# Patient Record
Sex: Female | Born: 1975 | Race: White | Hispanic: No | Marital: Married | Smoking: Current every day smoker
Health system: Southern US, Community
[De-identification: ages and names within clinical notes are randomized; demographics above are authoritative.]

## PROBLEM LIST (undated history)

## (undated) DIAGNOSIS — F419 Anxiety disorder, unspecified: Secondary | ICD-10-CM

## (undated) DIAGNOSIS — Z789 Other specified health status: Secondary | ICD-10-CM

## (undated) DIAGNOSIS — F32A Depression, unspecified: Secondary | ICD-10-CM

## (undated) DIAGNOSIS — N83209 Unspecified ovarian cyst, unspecified side: Secondary | ICD-10-CM

## (undated) DIAGNOSIS — Z8619 Personal history of other infectious and parasitic diseases: Secondary | ICD-10-CM

## (undated) DIAGNOSIS — F329 Major depressive disorder, single episode, unspecified: Secondary | ICD-10-CM

## (undated) HISTORY — PX: TUBAL LIGATION: SHX77

## (undated) HISTORY — PX: APPENDECTOMY: SHX54

---

## 2005-05-20 ENCOUNTER — Ambulatory Visit: Payer: Self-pay

## 2005-10-15 ENCOUNTER — Emergency Department: Payer: Self-pay | Admitting: Emergency Medicine

## 2006-04-15 ENCOUNTER — Emergency Department: Payer: Self-pay | Admitting: Emergency Medicine

## 2006-04-16 ENCOUNTER — Emergency Department: Payer: Self-pay

## 2007-03-15 ENCOUNTER — Emergency Department: Payer: Self-pay | Admitting: Unknown Physician Specialty

## 2007-09-06 ENCOUNTER — Emergency Department: Payer: Self-pay | Admitting: Emergency Medicine

## 2008-02-20 ENCOUNTER — Other Ambulatory Visit: Payer: Self-pay

## 2008-02-20 ENCOUNTER — Emergency Department: Payer: Self-pay | Admitting: Emergency Medicine

## 2008-03-09 ENCOUNTER — Emergency Department: Payer: Self-pay | Admitting: Internal Medicine

## 2008-07-31 ENCOUNTER — Emergency Department: Payer: Self-pay | Admitting: Unknown Physician Specialty

## 2008-12-04 ENCOUNTER — Emergency Department: Payer: Self-pay | Admitting: Unknown Physician Specialty

## 2009-01-10 ENCOUNTER — Emergency Department: Payer: Self-pay | Admitting: Internal Medicine

## 2009-01-11 ENCOUNTER — Emergency Department: Payer: Self-pay | Admitting: Emergency Medicine

## 2009-07-05 ENCOUNTER — Emergency Department: Payer: Self-pay | Admitting: Emergency Medicine

## 2009-11-28 ENCOUNTER — Ambulatory Visit: Payer: Self-pay

## 2011-03-11 ENCOUNTER — Emergency Department: Payer: Self-pay | Admitting: Emergency Medicine

## 2011-03-21 ENCOUNTER — Emergency Department: Payer: Self-pay | Admitting: Unknown Physician Specialty

## 2011-07-22 ENCOUNTER — Emergency Department: Payer: Self-pay | Admitting: *Deleted

## 2013-08-23 ENCOUNTER — Emergency Department: Payer: Self-pay | Admitting: Emergency Medicine

## 2013-09-12 ENCOUNTER — Emergency Department: Payer: Self-pay | Admitting: Emergency Medicine

## 2013-09-12 LAB — COMPREHENSIVE METABOLIC PANEL
ALK PHOS: 54 U/L
ANION GAP: 9 (ref 7–16)
Albumin: 3.5 g/dL (ref 3.4–5.0)
BUN: 13 mg/dL (ref 7–18)
Bilirubin,Total: 0.2 mg/dL (ref 0.2–1.0)
CALCIUM: 8.7 mg/dL (ref 8.5–10.1)
CHLORIDE: 107 mmol/L (ref 98–107)
Co2: 28 mmol/L (ref 21–32)
Creatinine: 0.9 mg/dL (ref 0.60–1.30)
EGFR (African American): >60
GLUCOSE: 91 mg/dL (ref 65–99)
OSMOLALITY: 287 (ref 275–301)
POTASSIUM: 4 mmol/L (ref 3.5–5.1)
SGOT(AST): 28 U/L (ref 15–37)
SGPT (ALT): 47 U/L (ref 12–78)
Sodium: 144 mmol/L (ref 136–145)
Total Protein: 6.7 g/dL (ref 6.4–8.2)

## 2013-09-12 LAB — URINALYSIS, COMPLETE
Bilirubin,UR: NEGATIVE
Glucose,UR: NEGATIVE mg/dL (ref 0–75)
Ketone: NEGATIVE
LEUKOCYTE ESTERASE: NEGATIVE
NITRITE: NEGATIVE
PROTEIN: NEGATIVE
Ph: 6 (ref 4.5–8.0)
RBC,UR: <1 /HPF (ref 0–5)
Specific Gravity: 1.018 (ref 1.003–1.030)
Squamous Epithelial: 6
WBC UR: 1 /HPF (ref 0–5)

## 2013-09-12 LAB — CBC
HCT: 42.7 % (ref 35.0–47.0)
HGB: 14.5 g/dL (ref 12.0–16.0)
MCH: 30.4 pg (ref 26.0–34.0)
MCHC: 34 g/dL (ref 32.0–36.0)
MCV: 90 fL (ref 80–100)
Platelet: 335 10*3/uL (ref 150–440)
RBC: 4.76 10*6/uL (ref 3.80–5.20)
RDW: 13.4 % (ref 11.5–14.5)
WBC: 9 10*3/uL (ref 3.6–11.0)

## 2013-09-12 LAB — GC/CHLAMYDIA PROBE AMP

## 2013-09-12 LAB — WET PREP, GENITAL

## 2014-05-11 ENCOUNTER — Emergency Department: Payer: Self-pay | Admitting: Internal Medicine

## 2014-06-27 ENCOUNTER — Emergency Department: Payer: Self-pay | Admitting: Emergency Medicine

## 2014-06-27 LAB — URINALYSIS, COMPLETE
BACTERIA: NONE SEEN
BILIRUBIN, UR: NEGATIVE
Blood: NEGATIVE
GLUCOSE, UR: NEGATIVE mg/dL (ref 0–75)
Ketone: NEGATIVE
Leukocyte Esterase: NEGATIVE
Nitrite: NEGATIVE
PH: 6 (ref 4.5–8.0)
Protein: NEGATIVE
RBC,UR: 1 /HPF (ref 0–5)
Specific Gravity: 1.016 (ref 1.003–1.030)
WBC UR: 1 /HPF (ref 0–5)

## 2014-06-27 LAB — COMPREHENSIVE METABOLIC PANEL
ALBUMIN: 3.3 g/dL — AB (ref 3.4–5.0)
ALK PHOS: 58 U/L
ALT: 24 U/L
Anion Gap: 7 (ref 7–16)
BILIRUBIN TOTAL: 0.1 mg/dL — AB (ref 0.2–1.0)
BUN: 12 mg/dL (ref 7–18)
CALCIUM: 8.5 mg/dL (ref 8.5–10.1)
CO2: 28 mmol/L (ref 21–32)
Chloride: 111 mmol/L — ABNORMAL HIGH (ref 98–107)
Creatinine: 0.87 mg/dL (ref 0.60–1.30)
EGFR (African American): >60
EGFR (Non-African Amer.): >60
Glucose: 85 mg/dL (ref 65–99)
Osmolality: 290 (ref 275–301)
Potassium: 3.9 mmol/L (ref 3.5–5.1)
SGOT(AST): 20 U/L (ref 15–37)
Sodium: 146 mmol/L — ABNORMAL HIGH (ref 136–145)
Total Protein: 6.7 g/dL (ref 6.4–8.2)

## 2014-06-27 LAB — CBC WITH DIFFERENTIAL/PLATELET
BASOS ABS: 0.2 10*3/uL — AB (ref 0.0–0.1)
Basophil %: 1.4 %
EOS ABS: 0.2 10*3/uL (ref 0.0–0.7)
Eosinophil %: 1.7 %
HCT: 43.4 % (ref 35.0–47.0)
HGB: 14.3 g/dL (ref 12.0–16.0)
Lymphocyte #: 3.8 10*3/uL — ABNORMAL HIGH (ref 1.0–3.6)
Lymphocyte %: 34.2 %
MCH: 29.8 pg (ref 26.0–34.0)
MCHC: 33 g/dL (ref 32.0–36.0)
MCV: 90 fL (ref 80–100)
Monocyte #: 0.7 x10 3/mm (ref 0.2–0.9)
Monocyte %: 6.2 %
NEUTROS ABS: 6.3 10*3/uL (ref 1.4–6.5)
Neutrophil %: 56.5 %
Platelet: 360 10*3/uL (ref 150–440)
RBC: 4.8 10*6/uL (ref 3.80–5.20)
RDW: 13.6 % (ref 11.5–14.5)
WBC: 11.2 10*3/uL — AB (ref 3.6–11.0)

## 2014-06-28 IMAGING — CT CT ABD-PELV W/ CM
2 of 4 series · 17 of 46 positions shown, 19 images · IV contrast (isovue)
Comparison: None.

CLINICAL DATA: Left lower quadrant and flank pain.

EXAM:
CT ABDOMEN AND PELVIS WITH CONTRAST
TECHNIQUE: Multidetector CT imaging of the abdomen and pelvis was performed
using the standard protocol following bolus administration of
intravenous contrast.
CONTRAST:  100 mL Isovue 370

[Series 2: routine abd pel with · axial · 0.69mm/px · z∈[-486,-61]mm · 14 of 93 slices shown, 16 images]
[im 4/93  soft-tissue]
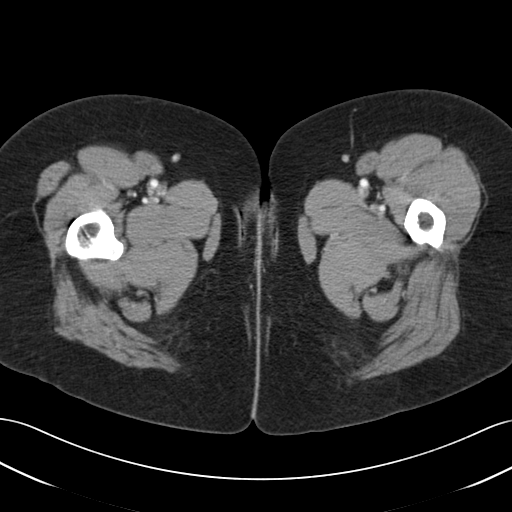
[im 4/93  bone]
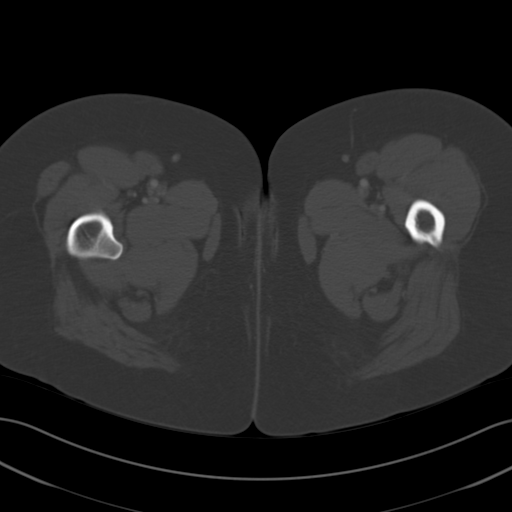
[im 12/93  soft-tissue]
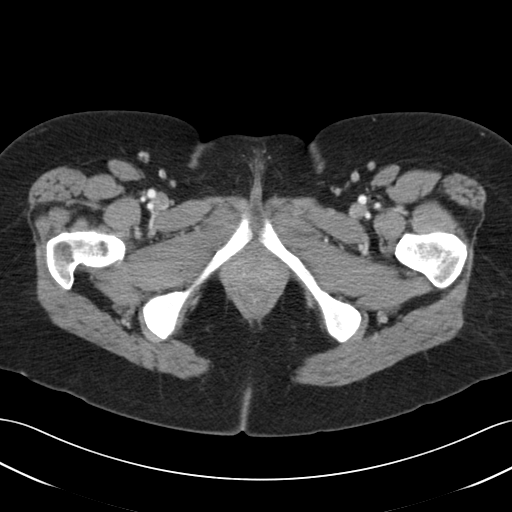
[im 20/93  soft-tissue]
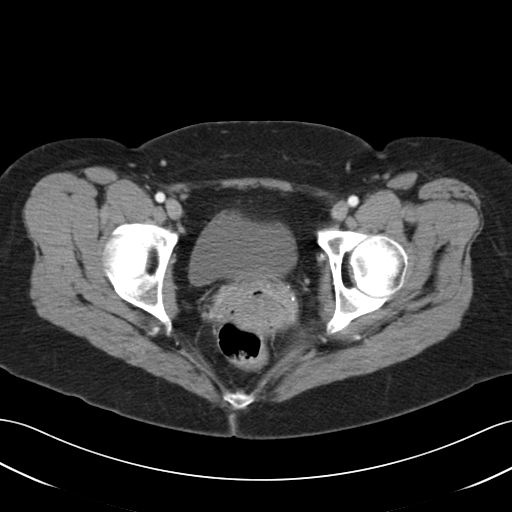
[im 24/93  soft-tissue]
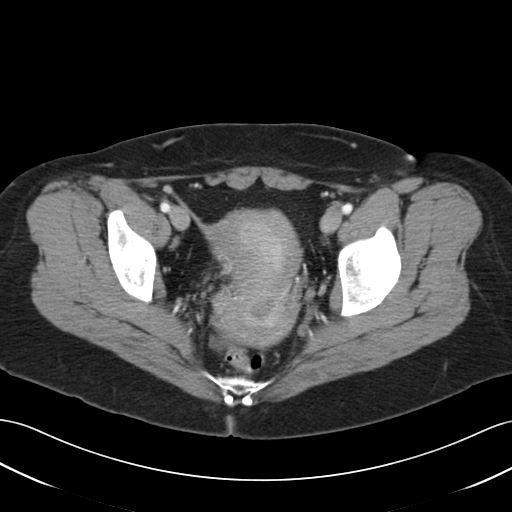
[im 31/93  soft-tissue]
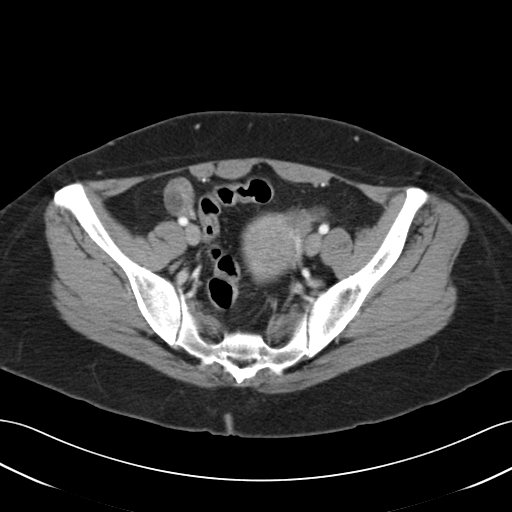
[im 39/93  soft-tissue]
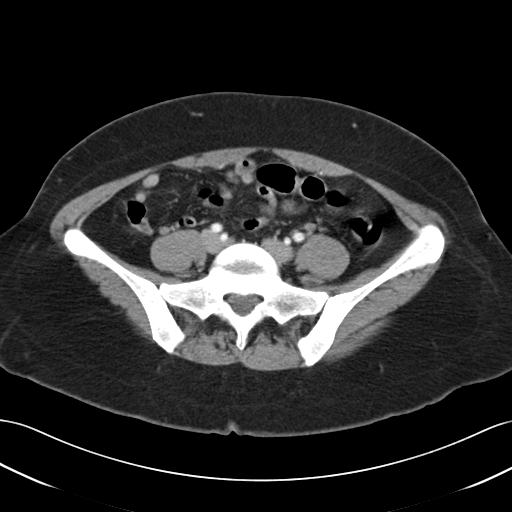
[im 43/93  soft-tissue]
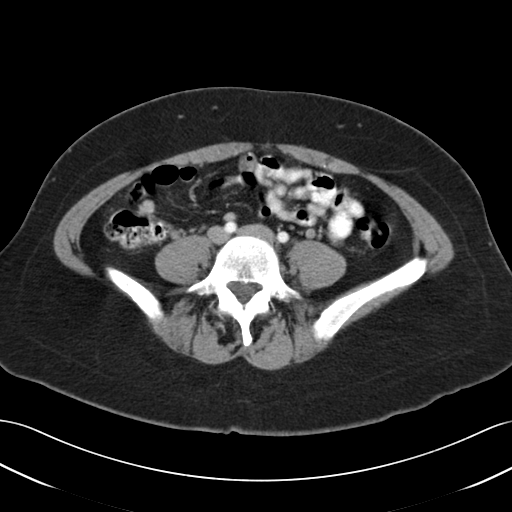
[im 50/93  soft-tissue]
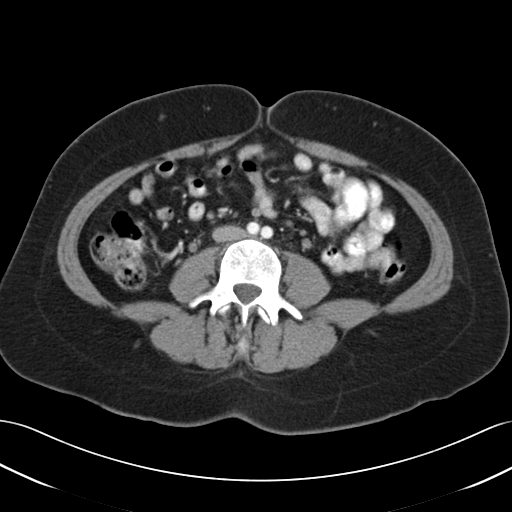
[im 54/93  soft-tissue]
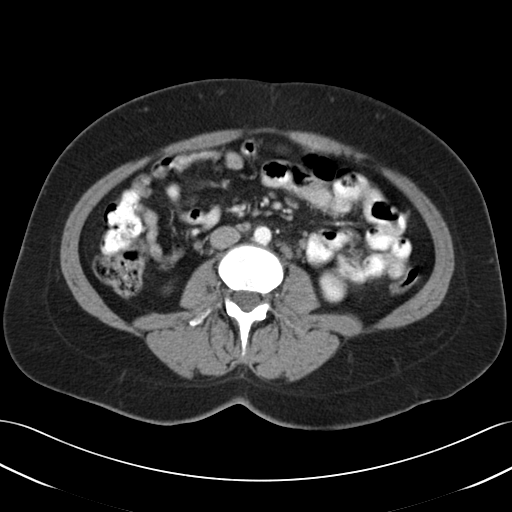
[im 54/93  bone]
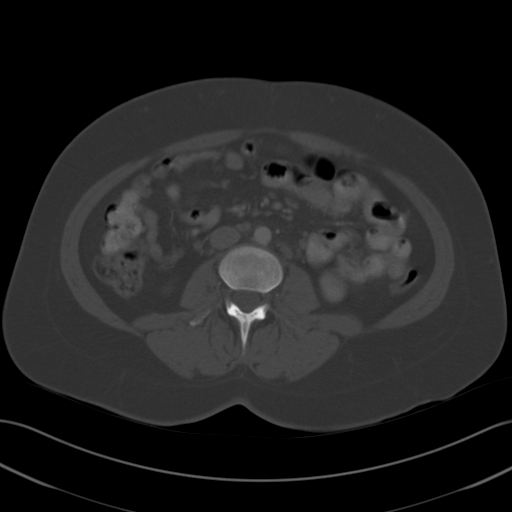
[im 62/93  soft-tissue]
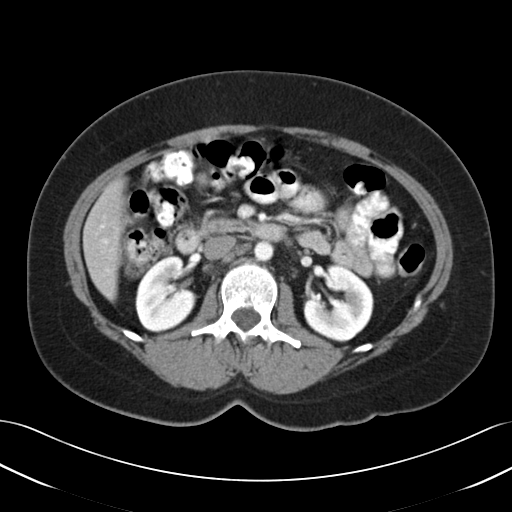
[im 70/93  soft-tissue]
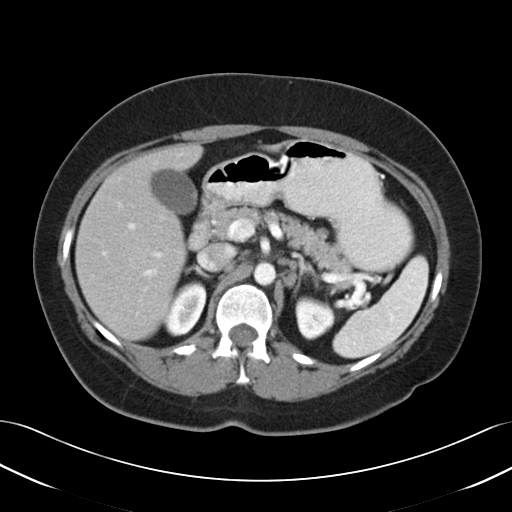
[im 73/93  soft-tissue]
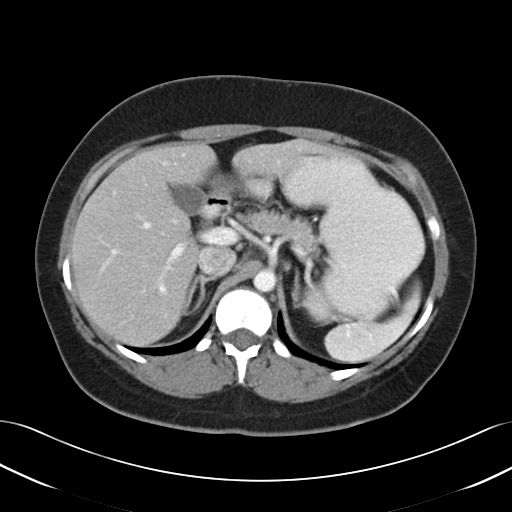
[im 81/93  soft-tissue]
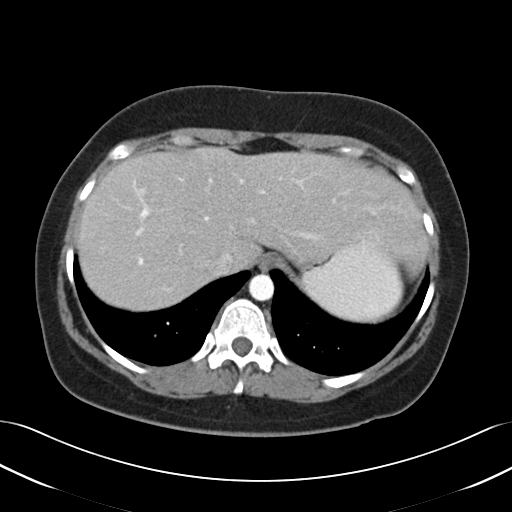
[im 89/93  soft-tissue]
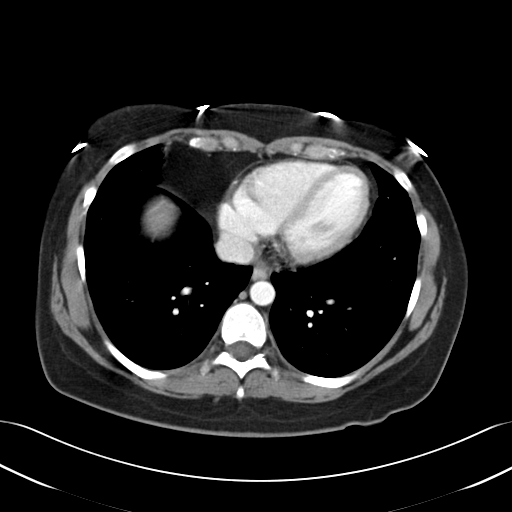

[Series 5: cor routine abd pel with · coronal · 0.93mm/px · 3 of 120 slices shown]
[im 40/120  soft-tissue]
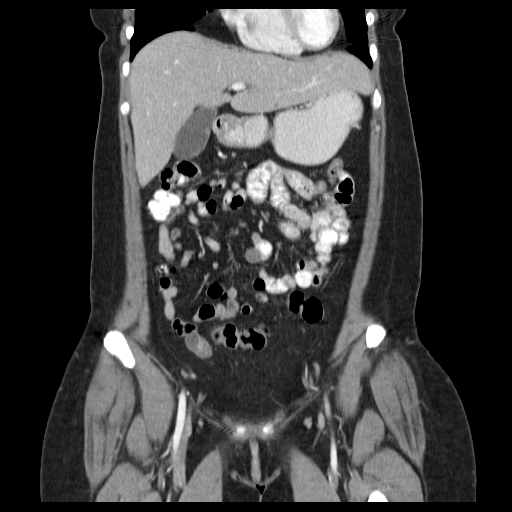
[im 53/120  soft-tissue]
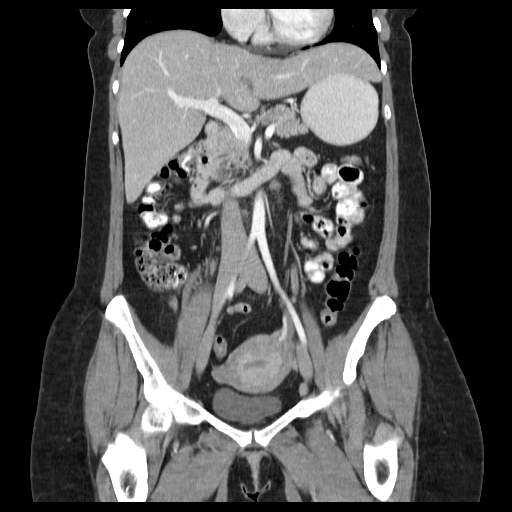
[im 67/120  soft-tissue]
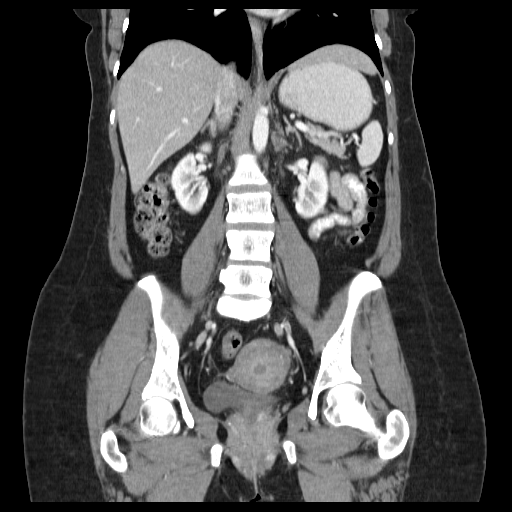

[17 of 46 positions shown; findings below may reference images not displayed]

FINDINGS: Lung bases are clear.  Negative for free air.

Normal appearance of the liver, gallbladder and portal venous
system. Normal appearance of the pancreas, spleen, adrenal glands
and kidneys. There are multiple small lymph nodes in the periaortic
region. There is mild fat stranding around the small lymph nodes.
Index periaortic lymph node measures 8 mm in the short axis on
sequence 2, image 34. Small lymph nodes in the gastrohepatic
ligament. Small lymph nodes in the aortocaval stations. Mildly
prominent left iliac lymph node on sequence 2, image 71.

No acute bone abnormality. A trace amount of free fluid in the
pelvis.

The endometrium is mildly prominent. No gross abnormality to the
uterus. Evidence for bilateral ovarian follicles. Normal appearance
of the urinary bladder. Normal appearance of the small and large
bowel. Appendix is not well visualized but no inflammatory changes
in the right lower quadrant.
IMPRESSION: Multiple small lymph nodes in the retroperitoneum with mild fat
stranding. These findings are nonspecific but could be reactive in
etiology. Consider follow-up imaging to evaluate for stability.

Trace free fluid in the pelvis is likely physiologic.

## 2014-08-08 ENCOUNTER — Emergency Department: Payer: Self-pay | Admitting: Emergency Medicine

## 2014-08-08 LAB — URINALYSIS, COMPLETE
BILIRUBIN, UR: NEGATIVE
Glucose,UR: NEGATIVE mg/dL (ref 0–75)
KETONE: NEGATIVE
Nitrite: NEGATIVE
PH: 6 (ref 4.5–8.0)
Protein: 30
RBC,UR: 554 /HPF (ref 0–5)
SPECIFIC GRAVITY: 1.015 (ref 1.003–1.030)
Squamous Epithelial: <1
WBC UR: 169 /HPF (ref 0–5)

## 2014-08-08 LAB — COMPREHENSIVE METABOLIC PANEL
ALK PHOS: 57 U/L
AST: 18 U/L (ref 15–37)
Albumin: 3.2 g/dL — ABNORMAL LOW (ref 3.4–5.0)
Anion Gap: 6 — ABNORMAL LOW (ref 7–16)
BILIRUBIN TOTAL: 0.1 mg/dL — AB (ref 0.2–1.0)
BUN: 9 mg/dL (ref 7–18)
CALCIUM: 8.1 mg/dL — AB (ref 8.5–10.1)
CHLORIDE: 112 mmol/L — AB (ref 98–107)
CREATININE: 0.69 mg/dL (ref 0.60–1.30)
Co2: 26 mmol/L (ref 21–32)
EGFR (Non-African Amer.): >60
Glucose: 99 mg/dL (ref 65–99)
Osmolality: 286 (ref 275–301)
Potassium: 3.7 mmol/L (ref 3.5–5.1)
SGPT (ALT): 25 U/L
Sodium: 144 mmol/L (ref 136–145)
Total Protein: 6.5 g/dL (ref 6.4–8.2)

## 2014-08-08 LAB — CBC WITH DIFFERENTIAL/PLATELET
BASOS ABS: 0.3 10*3/uL — AB (ref 0.0–0.1)
Basophil %: 3.3 %
EOS ABS: 0.2 10*3/uL (ref 0.0–0.7)
Eosinophil %: 2.4 %
HCT: 43.2 % (ref 35.0–47.0)
HGB: 14.3 g/dL (ref 12.0–16.0)
Lymphocyte #: 3.2 10*3/uL (ref 1.0–3.6)
Lymphocyte %: 31.6 %
MCH: 30.3 pg (ref 26.0–34.0)
MCHC: 33.1 g/dL (ref 32.0–36.0)
MCV: 91 fL (ref 80–100)
MONOS PCT: 4.8 %
Monocyte #: 0.5 x10 3/mm (ref 0.2–0.9)
Neutrophil #: 5.8 10*3/uL (ref 1.4–6.5)
Neutrophil %: 57.9 %
Platelet: 309 10*3/uL (ref 150–440)
RBC: 4.72 10*6/uL (ref 3.80–5.20)
RDW: 13.5 % (ref 11.5–14.5)
WBC: 10.1 10*3/uL (ref 3.6–11.0)

## 2015-02-04 ENCOUNTER — Emergency Department
Admission: EM | Admit: 2015-02-04 | Discharge: 2015-02-04 | Disposition: A | Discharge status: Home / Self Care | Attending: Emergency Medicine | Admitting: Emergency Medicine

## 2015-02-04 ENCOUNTER — Encounter: Payer: Self-pay | Admitting: *Deleted

## 2015-02-04 ENCOUNTER — Emergency Department

## 2015-02-04 DIAGNOSIS — R102 Pelvic and perineal pain: Secondary | ICD-10-CM

## 2015-02-04 DIAGNOSIS — Z3202 Encounter for pregnancy test, result negative: Secondary | ICD-10-CM | POA: Insufficient documentation

## 2015-02-04 DIAGNOSIS — Z720 Tobacco use: Secondary | ICD-10-CM | POA: Diagnosis not present

## 2015-02-04 DIAGNOSIS — Z79899 Other long term (current) drug therapy: Secondary | ICD-10-CM | POA: Diagnosis not present

## 2015-02-04 DIAGNOSIS — N939 Abnormal uterine and vaginal bleeding, unspecified: Secondary | ICD-10-CM | POA: Diagnosis not present

## 2015-02-04 DIAGNOSIS — N8320 Unspecified ovarian cysts: Secondary | ICD-10-CM | POA: Diagnosis not present

## 2015-02-04 DIAGNOSIS — N83202 Unspecified ovarian cyst, left side: Secondary | ICD-10-CM

## 2015-02-04 HISTORY — DX: Unspecified ovarian cyst, unspecified side: N83.209

## 2015-02-04 HISTORY — DX: Anxiety disorder, unspecified: F41.9

## 2015-02-04 HISTORY — DX: Depression, unspecified: F32.A

## 2015-02-04 HISTORY — DX: Major depressive disorder, single episode, unspecified: F32.9

## 2015-02-04 LAB — CBC WITH DIFFERENTIAL/PLATELET
Basophils Absolute: 0.1 10*3/uL (ref 0–0.1)
Basophils Relative: 1 %
EOS ABS: 0.2 10*3/uL (ref 0–0.7)
Eosinophils Relative: 2 %
HCT: 42.4 % (ref 35.0–47.0)
Hemoglobin: 13.8 g/dL (ref 12.0–16.0)
LYMPHS ABS: 2.6 10*3/uL (ref 1.0–3.6)
LYMPHS PCT: 21 %
MCH: 28.9 pg (ref 26.0–34.0)
MCHC: 32.6 g/dL (ref 32.0–36.0)
MCV: 88.7 fL (ref 80.0–100.0)
Monocytes Absolute: 0.9 10*3/uL (ref 0.2–0.9)
Monocytes Relative: 8 %
NEUTROS ABS: 8.6 10*3/uL — AB (ref 1.4–6.5)
NEUTROS PCT: 68 %
Platelets: 328 10*3/uL (ref 150–440)
RBC: 4.79 MIL/uL (ref 3.80–5.20)
RDW: 13.3 % (ref 11.5–14.5)
WBC: 12.5 10*3/uL — AB (ref 3.6–11.0)

## 2015-02-04 LAB — HCG, QUANTITATIVE, PREGNANCY

## 2015-02-04 MED ORDER — ONDANSETRON HCL 4 MG/2ML IJ SOLN
4.0000 mg | Freq: Once | INTRAMUSCULAR | Status: AC
  Administered 2015-02-04: 4 mg via INTRAVENOUS

## 2015-02-04 MED ORDER — ONDANSETRON HCL 4 MG/2ML IJ SOLN
INTRAMUSCULAR | Status: AC
  Administered 2015-02-04: 4 mg via INTRAVENOUS
  Filled 2015-02-04: qty 2

## 2015-02-04 MED ORDER — ONDANSETRON 4 MG PO TBDP
ORAL_TABLET | ORAL | Status: AC
  Administered 2015-02-04: 4 mg
  Filled 2015-02-04: qty 1

## 2015-02-04 MED ORDER — MORPHINE SULFATE 4 MG/ML IJ SOLN
4.0000 mg | Freq: Once | INTRAMUSCULAR | Status: AC
  Administered 2015-02-04: 4 mg via INTRAVENOUS

## 2015-02-04 MED ORDER — OXYCODONE-ACETAMINOPHEN 5-325 MG PO TABS: 1.0000 | ORAL_TABLET | ORAL | Status: DC | PRN

## 2015-02-04 MED ORDER — ONDANSETRON HCL 4 MG PO TABS: 4.0000 mg | ORAL_TABLET | Freq: Three times a day (TID) | ORAL | Status: DC | PRN

## 2015-02-04 MED ORDER — IBUPROFEN 800 MG PO TABS: 800.0000 mg | ORAL_TABLET | Freq: Three times a day (TID) | ORAL | Status: DC | PRN

## 2015-02-04 MED ORDER — SODIUM CHLORIDE 0.9 % IV BOLUS (SEPSIS)
1000.0000 mL | Freq: Once | INTRAVENOUS | Status: AC
  Administered 2015-02-04: 1000 mL via INTRAVENOUS

## 2015-02-04 MED ORDER — OXYCODONE-ACETAMINOPHEN 5-325 MG PO TABS
ORAL_TABLET | ORAL | Status: AC
  Administered 2015-02-04: 1
  Filled 2015-02-04: qty 1

## 2015-02-04 MED ORDER — MORPHINE SULFATE 4 MG/ML IJ SOLN
INTRAMUSCULAR | Status: AC
  Administered 2015-02-04: 4 mg via INTRAVENOUS
  Filled 2015-02-04: qty 1

## 2015-02-04 NOTE — ED Notes (Signed)
Pt reports right sided pelvic pain starting Wednesday, and IUD fell out Friday and bleeding started.  Pt reports bleeding has increased since then.  Pt reports she goes through a pad once every 2 hours.  Pt ambulatory to room.  Pt NAD at this time.

## 2015-02-04 NOTE — ED Provider Notes (Signed)
[facility] Emergency Department Provider Note  ____________________________________________  Time seen: Approximately 4:39 AM  I have reviewed the triage vital signs and the nursing notes.   HISTORY  Chief Complaint Pelvic Pain    HPI Teresa Wise is a 39 y.o. female who presents with a three-day history of pelvic pain, right greater than left. Patient states she began with vaginal spotting which progressed to heavy vaginal bleeding after her IUD fell out one day ago. Last sexual intercourse 6 days ago. Patient denies chest pain, shortness of breath, dizziness, lightheadedness, fainting. Complains of 10/10 sharp, right-sided pelvic pain radiating to left side. Nothing makes the pain better or worse.   Past Medical History  Diagnosis Date  . Anxiety   . Depressed   . Ovarian cyst   Dermoid cyst  There are no active problems to display for this patient.   Past Surgical History  Procedure Laterality Date  . Appendectomy    . Tubal ligation Bilateral   Dermoid cyst removal  Current Outpatient Rx  Name  Route  Sig  Dispense  Refill  . ALPRAZolam (XANAX) 1 MG tablet   Oral   Take 1 mg by mouth 4 (four) times daily.         Marland Kitchen FLUoxetine (PROZAC) 40 MG capsule   Oral   Take 40 mg by mouth daily.           Allergies Review of patient's allergies indicates no known allergies.  History reviewed. No pertinent family history.  Social History History  Substance Use Topics  . Smoking status: Current Every Day Smoker    Types: Cigarettes  . Smokeless tobacco: Never Used  . Alcohol Use: No    Review of Systems Constitutional: No fever/chills Eyes: No visual changes. ENT: No sore throat. Cardiovascular: Denies chest pain. Respiratory: Denies shortness of breath. Gastrointestinal: Positive for pelvic pain.  No nausea, no vomiting.  No diarrhea.  No constipation. Genitourinary: Positive for vaginal bleeding. Negative for  dysuria. Musculoskeletal: Negative for back pain. Skin: Negative for rash. Neurological: Negative for headaches, focal weakness or numbness.  10-point ROS otherwise negative.  ____________________________________________   PHYSICAL EXAM:  VITAL SIGNS: ED Triage Vitals  Enc Vitals Group     BP 02/04/15 0358 103/77 mmHg     Pulse Rate 02/04/15 0358 94     Resp 02/04/15 0358 18     Temp 02/04/15 0358 98.2 F (36.8 C)     Temp Source 02/04/15 0358 Oral     SpO2 02/04/15 0358 100 %     Weight 02/04/15 0358 165 lb (74.844 kg)     Height 02/04/15 0358 5' (1.524 m)     Head Cir --      Peak Flow --      Pain Score 02/04/15 0359 10     Pain Loc --      Pain Edu? --      Excl. in GC? --     Constitutional: Alert and oriented. Well appearing and in mild acute distress. Eyes: Conjunctivae are normal. PERRL. EOMI. Head: Atraumatic. Nose: No congestion/rhinnorhea. Mouth/Throat: Mucous membranes are moist.  Oropharynx non-erythematous. Neck: No stridor.   Cardiovascular: Normal rate, regular rhythm. Grossly normal heart sounds.  Good peripheral circulation. Respiratory: Normal respiratory effort.  No retractions. Lungs CTAB. Gastrointestinal: Soft, tender to palpation lower pelvis/abdomen, right greater than left. No tenderness at McBurney's point. No rebound/guarding. No distention. No abdominal bruits. No CVA tenderness. Genitourinary: External genitalia normal without lesions. Speculum  exam reveals mild vaginal bleeding, cervix closed. Bimanual exam reveals bilateral adnexal tenderness. Musculoskeletal: No lower extremity tenderness nor edema.  No joint effusions. Neurologic:  Normal speech and language. No gross focal neurologic deficits are appreciated. Speech is normal. No gait instability. Skin:  Skin is warm, dry and intact. No rash noted. Psychiatric: Mood and affect are normal. Speech and behavior are normal.  ____________________________________________   LABS (all labs  ordered are listed, but only abnormal results are displayed)  Labs Reviewed  CBC WITH DIFFERENTIAL/PLATELET - Abnormal; Notable for the following:    WBC 12.5 (*)    Neutro Abs 8.6 (*)    All other components within normal limits  HCG, QUANTITATIVE, PREGNANCY   ____________________________________________  EKG  None ____________________________________________  RADIOLOGY  Ultrasound transvaginal non-OB interpreted per Dr. Clovis Riley: Simple unilocular left ovarian cyst. This is almost certainly benign, and no specific imaging follow up is recommended according to the Society of Radiologists in Ultrasound2010 Consensus Conference Statement Grier Rocher et al. Management of Asymptomatic Ovarian and Other Adnexal Cysts Imaged at [place]: Society of Radiologists in Ultrasound Consensus Conference Statement 2010. Radiology 256 (Sept 2010): 943-954.)  Normal uterus and right ovary. ____________________________________________   PROCEDURES  Procedure(s) performed: None  Critical Care performed: No  ____________________________________________   INITIAL IMPRESSION / ASSESSMENT AND PLAN / ED COURSE  Pertinent labs & imaging results that were available during my care of the patient were reviewed by me and considered in my medical decision making (see chart for details).  39 year old female presenting with pelvic pain and increased vaginal bleeding after IUD fell out. Will check basic labs including hCG, pelvic ultrasound, IV analgesia.  Patient improved. Discussed with patient results of ultrasound and lab work. Plan for analgesia and gynecological follow-up. Strict return precautions given. Patient verbalizes understanding and agrees with plan of care. ____________________________________________   FINAL CLINICAL IMPRESSION(S) / ED DIAGNOSES  Final diagnoses:  Vaginal bleeding  Cyst of left ovary  Pelvic pain in female      Irean Hong, MD 02/04/15 [phone]

## 2015-02-04 NOTE — ED Notes (Signed)
Pt c/o RLQ/R pelvic pain starting this past Wed. Pt states her IUD fell out on Friday. Pt had IUD placed on December 05, 2014. Pt c/o increasing pelvic pain and vaginal bleeding starting on Friday.

## 2015-02-04 NOTE — Discharge Instructions (Signed)
1. Take pain medicines as needed (Motrin/Percocet #15). 2. Return to the ER for worsening symptoms, persistent vomiting, difficulty breathing or other concerns.  Pelvic Pain Female pelvic pain can be caused by many different things and start from a variety of places. Pelvic pain refers to pain that is located in the lower half of the abdomen and between your hips. The pain may occur over a short period of time (acute) or may be reoccurring (chronic). The cause of pelvic pain may be related to disorders affecting the female reproductive organs (gynecologic), but it may also be related to the bladder, kidney stones, an intestinal complication, or muscle or skeletal problems. Getting help right away for pelvic pain is important, especially if there has been severe, sharp, or a sudden onset of unusual pain. It is also important to get help right away because some types of pelvic pain can be life threatening.  CAUSES  Below are only some of the causes of pelvic pain. The causes of pelvic pain can be in one of several categories.   Gynecologic.  Pelvic inflammatory disease.  Sexually transmitted infection.  Ovarian cyst or a twisted ovarian ligament (ovarian torsion).  Uterine lining that grows outside the uterus (endometriosis).  Fibroids, cysts, or tumors.  Ovulation.  Pregnancy.  Pregnancy that occurs outside the uterus (ectopic pregnancy).  Miscarriage.  Labor.  Abruption of the placenta or ruptured uterus.  Infection.  Uterine infection (endometritis).  Bladder infection.  Diverticulitis.  Miscarriage related to a uterine infection (septic abortion).  Bladder.  Inflammation of the bladder (cystitis).  Kidney stone(s).  Gastrointestinal.  Constipation.  Diverticulitis.  Neurologic.  Trauma.  Feeling pelvic pain because of mental or emotional causes (psychosomatic).  Cancers of the bowel or pelvis. EVALUATION  Your caregiver will want to take a careful  history of your concerns. This includes recent changes in your health, a careful gynecologic history of your periods (menses), and a sexual history. Obtaining your family history and medical history is also important. Your caregiver may suggest a pelvic exam. A pelvic exam will help identify the location and severity of the pain. It also helps in the evaluation of which organ system may be involved. In order to identify the cause of the pelvic pain and be properly treated, your caregiver may order tests. These tests may include:   A pregnancy test.  Pelvic ultrasonography.  An X-ray exam of the abdomen.  A urinalysis or evaluation of vaginal discharge.  Blood tests. HOME CARE INSTRUCTIONS   Only take over-the-counter or prescription medicines for pain, discomfort, or fever as directed by your caregiver.   Rest as directed by your caregiver.   Eat a balanced diet.   Drink enough fluids to make your urine clear or pale yellow, or as directed.   Avoid sexual intercourse if it causes pain.   Apply warm or cold compresses to the lower abdomen depending on which one helps the pain.   Avoid stressful situations.   Keep a journal of your pelvic pain. Write down when it started, where the pain is located, and if there are things that seem to be associated with the pain, such as food or your menstrual cycle.  Follow up with your caregiver as directed.  SEEK MEDICAL CARE IF:  Your medicine does not help your pain.  You have abnormal vaginal discharge. SEEK IMMEDIATE MEDICAL CARE IF:   You have heavy bleeding from the vagina.   Your pelvic pain increases.   You feel light-headed or  faint.   You have chills.   You have pain with urination or blood in your urine.   You have uncontrolled diarrhea or vomiting.   You have a fever or persistent symptoms for more than 3 days.  You have a fever and your symptoms suddenly get worse.   You are being physically or  sexually abused.  MAKE SURE YOU:  Understand these instructions.  Will watch your condition.  Will get help if you are not doing well or get worse. Document Released: 07/08/2004 Document Revised: 12/26/2013 Document Reviewed: 12/01/2011 [facility] Patient Information 2015 [place], [place]. This information is not intended to replace advice given to you by your health care provider. Make sure you discuss any questions you have with your health care provider.  Ovarian Cyst An ovarian cyst is a fluid-filled sac that forms on an ovary. The ovaries are small organs that produce eggs in women. Various types of cysts can form on the ovaries. Most are not cancerous. Many do not cause problems, and they often go away on their own. Some may cause symptoms and require treatment. Common types of ovarian cysts include:  Functional cysts--These cysts may occur every month during the menstrual cycle. This is normal. The cysts usually go away with the next menstrual cycle if the woman does not get pregnant. Usually, there are no symptoms with a functional cyst.  Endometrioma cysts--These cysts form from the tissue that lines the uterus. They are also called "chocolate cysts" because they become filled with blood that turns brown. This type of cyst can cause pain in the lower abdomen during intercourse and with your menstrual period.  Cystadenoma cysts--This type develops from the cells on the outside of the ovary. These cysts can get very big and cause lower abdomen pain and pain with intercourse. This type of cyst can twist on itself, cut off its blood supply, and cause severe pain. It can also easily rupture and cause a lot of pain.  Dermoid cysts--This type of cyst is sometimes found in both ovaries. These cysts may contain different kinds of body tissue, such as skin, teeth, hair, or cartilage. They usually do not cause symptoms unless they get very big.  Theca lutein cysts--These cysts occur when too much  of a certain hormone (human chorionic gonadotropin) is produced and overstimulates the ovaries to produce an egg. This is most common after procedures used to assist with the conception of a baby (in vitro fertilization). CAUSES   Fertility drugs can cause a condition in which multiple large cysts are formed on the ovaries. This is called ovarian hyperstimulation syndrome.  A condition called polycystic ovary syndrome can cause hormonal imbalances that can lead to nonfunctional ovarian cysts. SIGNS AND SYMPTOMS  Many ovarian cysts do not cause symptoms. If symptoms are present, they may include:  Pelvic pain or pressure.  Pain in the lower abdomen.  Pain during sexual intercourse.  Increasing girth (swelling) of the abdomen.  Abnormal menstrual periods.  Increasing pain with menstrual periods.  Stopping having menstrual periods without being pregnant. DIAGNOSIS  These cysts are commonly found during a routine or annual pelvic exam. Tests may be ordered to find out more about the cyst. These tests may include:  Ultrasound.  X-ray of the pelvis.  CT scan.  MRI.  Blood tests. TREATMENT  Many ovarian cysts go away on their own without treatment. Your health care provider may want to check your cyst regularly for 2-3 months to see if it changes. For  women in menopause, it is particularly important to monitor a cyst closely because of the higher rate of ovarian cancer in menopausal women. When treatment is needed, it may include any of the following:  A procedure to drain the cyst (aspiration). This may be done using a long needle and ultrasound. It can also be done through a laparoscopic procedure. This involves using a thin, lighted tube with a tiny camera on the end (laparoscope) inserted through a small incision.  Surgery to remove the whole cyst. This may be done using laparoscopic surgery or an open surgery involving a larger incision in the lower abdomen.  Hormone treatment  or birth control pills. These methods are sometimes used to help dissolve a cyst. HOME CARE INSTRUCTIONS   Only take over-the-counter or prescription medicines as directed by your health care provider.  Follow up with your health care provider as directed.  Get regular pelvic exams and Pap tests. SEEK MEDICAL CARE IF:   Your periods are late, irregular, or painful, or they stop.  Your pelvic pain or abdominal pain does not go away.  Your abdomen becomes larger or swollen.  You have pressure on your bladder or trouble emptying your bladder completely.  You have pain during sexual intercourse.  You have feelings of fullness, pressure, or discomfort in your stomach.  You lose weight for no apparent reason.  You feel generally ill.  You become constipated.  You lose your appetite.  You develop acne.  You have an increase in body and facial hair.  You are gaining weight, without changing your exercise and eating habits.  You think you are pregnant. SEEK IMMEDIATE MEDICAL CARE IF:   You have increasing abdominal pain.  You feel sick to your stomach (nauseous), and you throw up (vomit).  You develop a fever that comes on suddenly.  You have abdominal pain during a bowel movement.  Your menstrual periods become heavier than usual. MAKE SURE YOU:  Understand these instructions.  Will watch your condition.  Will get help right away if you are not doing well or get worse. Document Released: 08/11/2005 Document Revised: 08/16/2013 Document Reviewed: 04/18/2013 [facility] Patient Information 2015 [place], [place]. This information is not intended to replace advice given to you by your health care provider. Make sure you discuss any questions you have with your health care provider.

## 2015-06-02 ENCOUNTER — Emergency Department
Admission: EM | Admit: 2015-06-02 | Discharge: 2015-06-02 | Disposition: A | Discharge status: Home / Self Care | Attending: Emergency Medicine | Admitting: Emergency Medicine

## 2015-06-02 ENCOUNTER — Encounter: Payer: Self-pay | Admitting: *Deleted

## 2015-06-02 DIAGNOSIS — Z720 Tobacco use: Secondary | ICD-10-CM | POA: Insufficient documentation

## 2015-06-02 DIAGNOSIS — M5432 Sciatica, left side: Secondary | ICD-10-CM

## 2015-06-02 DIAGNOSIS — Z79899 Other long term (current) drug therapy: Secondary | ICD-10-CM | POA: Insufficient documentation

## 2015-06-02 MED ORDER — OXYCODONE-ACETAMINOPHEN 5-325 MG PO TABS: 1.0000 | ORAL_TABLET | ORAL | Status: DC | PRN

## 2015-06-02 MED ORDER — IBUPROFEN 800 MG PO TABS: 800.0000 mg | ORAL_TABLET | Freq: Three times a day (TID) | ORAL | Status: DC | PRN

## 2015-06-02 MED ORDER — OXYCODONE-ACETAMINOPHEN 5-325 MG PO TABS
2.0000 | ORAL_TABLET | Freq: Once | ORAL | Status: AC
  Administered 2015-06-02: 2 via ORAL
  Filled 2015-06-02: qty 2

## 2015-06-02 MED ORDER — DIAZEPAM 5 MG PO TABS
5.0000 mg | ORAL_TABLET | Freq: Once | ORAL | Status: AC
  Administered 2015-06-02: 5 mg via ORAL
  Filled 2015-06-02: qty 1

## 2015-06-02 MED ORDER — METHOCARBAMOL 500 MG PO TABS: 500.0000 mg | ORAL_TABLET | Freq: Four times a day (QID) | ORAL | Status: DC | PRN

## 2015-06-02 MED ORDER — KETOROLAC TROMETHAMINE 60 MG/2ML IM SOLN
60.0000 mg | Freq: Once | INTRAMUSCULAR | Status: AC
  Administered 2015-06-02: 60 mg via INTRAMUSCULAR
  Filled 2015-06-02: qty 2

## 2015-06-02 NOTE — ED Notes (Signed)
Discussed discharge instructions, prescriptions, and follow-up care with patient. No questions or concerns at this time. Pt stable at discharge.  

## 2015-06-02 NOTE — ED Provider Notes (Signed)
[facility] Emergency Department Provider Note  ____________________________________________  Time seen: Approximately 4:46 PM  I have reviewed the triage vital signs and the nursing notes.   HISTORY  Chief Complaint Back Pain    HPI Teresa Wise is a 39 y.o. female presents for evaluation of sudden onset low back pain radiating down her leg starting this morning. Patient reports a history of sciatica flareups every now and then, states today is one of them.   Past Medical History  Diagnosis Date  . Anxiety   . Depressed   . Ovarian cyst     There are no active problems to display for this patient.   Past Surgical History  Procedure Laterality Date  . Appendectomy    . Tubal ligation Bilateral   . Tubal ligation      Current Outpatient Rx  Name  Route  Sig  Dispense  Refill  . ALPRAZolam (XANAX) 1 MG tablet   Oral   Take 1 mg by mouth 4 (four) times daily.         Marland Kitchen FLUoxetine (PROZAC) 40 MG capsule   Oral   Take 40 mg by mouth daily.         Marland Kitchen ibuprofen (ADVIL,MOTRIN) 800 MG tablet   Oral   Take 1 tablet (800 mg total) by mouth every 8 (eight) hours as needed.   30 tablet   0   . methocarbamol (ROBAXIN) 500 MG tablet   Oral   Take 1 tablet (500 mg total) by mouth every 6 (six) hours as needed for muscle spasms.   30 tablet   0   . ondansetron (ZOFRAN) 4 MG tablet   Oral   Take 1 tablet (4 mg total) by mouth every 8 (eight) hours as needed for nausea or vomiting.   15 tablet   0   . oxyCODONE-acetaminophen (ROXICET) 5-325 MG tablet   Oral   Take 1-2 tablets by mouth every 4 (four) hours as needed for severe pain.   15 tablet   0     Allergies Review of patient's allergies indicates no known allergies.  History reviewed. No pertinent family history.  Social History Social History  Substance Use Topics  . Smoking status: Current Every Day Smoker -- 0.50 packs/day    Types: Cigarettes  . Smokeless tobacco: Never  Used  . Alcohol Use: No    Review of Systems Constitutional: No fever/chills Eyes: No visual changes. ENT: No sore throat. Cardiovascular: Denies chest pain. Respiratory: Denies shortness of breath. Gastrointestinal: No abdominal pain.  No nausea, no vomiting.  No diarrhea.  No constipation. Genitourinary: Negative for dysuria. Musculoskeletal: Positive for low back pain Skin: Negative for rash. Neurological: Negative for headaches, focal weakness or numbness.  10-point ROS otherwise negative.  ____________________________________________   PHYSICAL EXAM:  VITAL SIGNS: ED Triage Vitals  Enc Vitals Group     BP 06/02/15 1537 100/64 mmHg     Pulse Rate 06/02/15 1537 103     Resp 06/02/15 1537 20     Temp 06/02/15 1537 98.5 F (36.9 C)     Temp Source 06/02/15 1537 Oral     SpO2 06/02/15 1537 97 %     Weight 06/02/15 1537 164 lb (74.39 kg)     Height 06/02/15 1537  (1.549 m)     Head Cir --      Peak Flow --      Pain Score 06/02/15 1546 10     Pain Loc --  Pain Edu? --      Excl. in GC? --     Constitutional: Alert and oriented. Well appearing and in no acute distress. Neck: No stridor.   Cardiovascular: Normal rate, regular rhythm. Grossly normal heart sounds.  Good peripheral circulation. Respiratory: Normal respiratory effort.  No retractions. Lungs CTAB. Gastrointestinal: Soft and nontender. No distention. No abdominal bruits. No CVA tenderness. Musculoskeletal: Positive low back pain both spinal and paraspinal. Straight leg raise positive bilaterally at approximately 20. No numbness or paresthesia. Neurologic:  Normal speech and language. No gross focal neurologic deficits are appreciated. No gait instability. Skin:  Skin is warm, dry and intact. No rash noted. Psychiatric: Mood and affect are normal. Speech and behavior are normal.  ____________________________________________   LABS (all labs ordered are listed, but only abnormal results are  displayed)  Labs Reviewed - No data to display ____________________________________________    PROCEDURES  Procedure(s) performed: None  Critical Care performed: No  ____________________________________________   INITIAL IMPRESSION / ASSESSMENT AND PLAN / ED COURSE  Pertinent labs & imaging results that were available during my care of the patient were reviewed by me and considered in my medical decision making (see chart for details).  Acute exacerbation of sciatica. Rx given for Robaxin 500 mg, Motrin 800 mg, and on Percocet 5/325. Patient follow-up with PCP or return to the ER with any continuing or worsening symptomology. Patient voices no other emergency medical complaints at this time. ____________________________________________   FINAL CLINICAL IMPRESSION(S) / ED DIAGNOSES  Final diagnoses:  Sciatica of left side      Evangeline Dakin, PA-C 06/02/15 1731  Emily Filbert, MD 06/02/15 2253

## 2015-06-02 NOTE — Discharge Instructions (Signed)

## 2015-06-02 NOTE — ED Notes (Signed)
Pt reports back pain beginning at 5 this morning after standing to walk to the bathroom. Pt reports no trauma to area. Pain is radiating down both legs. Pt reports barely being able to walk due to the pain but is able to if needed. Pt has had hx of similar events twice in the past but denies seeing specialist.

## 2015-06-18 ENCOUNTER — Encounter: Payer: Self-pay | Admitting: Emergency Medicine

## 2015-06-18 ENCOUNTER — Emergency Department
Admission: EM | Admit: 2015-06-18 | Discharge: 2015-06-18 | Disposition: A | Discharge status: Home / Self Care | Attending: Emergency Medicine | Admitting: Emergency Medicine

## 2015-06-18 DIAGNOSIS — R519 Headache, unspecified: Secondary | ICD-10-CM

## 2015-06-18 DIAGNOSIS — R51 Headache: Secondary | ICD-10-CM

## 2015-06-18 DIAGNOSIS — L988 Other specified disorders of the skin and subcutaneous tissue: Secondary | ICD-10-CM | POA: Insufficient documentation

## 2015-06-18 DIAGNOSIS — R238 Other skin changes: Secondary | ICD-10-CM

## 2015-06-18 DIAGNOSIS — R5383 Other fatigue: Secondary | ICD-10-CM | POA: Insufficient documentation

## 2015-06-18 DIAGNOSIS — Z79899 Other long term (current) drug therapy: Secondary | ICD-10-CM | POA: Insufficient documentation

## 2015-06-18 DIAGNOSIS — Z720 Tobacco use: Secondary | ICD-10-CM | POA: Insufficient documentation

## 2015-06-18 HISTORY — DX: Personal history of other infectious and parasitic diseases: Z86.19

## 2015-06-18 MED ORDER — OXYCODONE-ACETAMINOPHEN 5-325 MG PO TABS
1.0000 | ORAL_TABLET | Freq: Once | ORAL | Status: AC
  Administered 2015-06-18: 1 via ORAL
  Filled 2015-06-18: qty 1

## 2015-06-18 MED ORDER — OXYCODONE-ACETAMINOPHEN 5-325 MG PO TABS: 1.0000 | ORAL_TABLET | Freq: Four times a day (QID) | ORAL | Status: DC | PRN

## 2015-06-18 MED ORDER — ACYCLOVIR 200 MG PO CAPS
400.0000 mg | ORAL_CAPSULE | Freq: Once | ORAL | Status: AC
  Administered 2015-06-18: 400 mg via ORAL
  Filled 2015-06-18 (×2): qty 2

## 2015-06-18 MED ORDER — DOXYCYCLINE HYCLATE 100 MG PO TABS: 100.0000 mg | ORAL_TABLET | Freq: Once | ORAL | Status: DC

## 2015-06-18 MED ORDER — ACYCLOVIR 200 MG PO CAPS: 400.0000 mg | ORAL_CAPSULE | Freq: Four times a day (QID) | ORAL | Status: DC

## 2015-06-18 MED ORDER — DOXYCYCLINE HYCLATE 100 MG PO TABS
100.0000 mg | ORAL_TABLET | Freq: Once | ORAL | Status: AC
  Administered 2015-06-18: 100 mg via ORAL
  Filled 2015-06-18 (×2): qty 1

## 2015-06-18 NOTE — Discharge Instructions (Signed)
As we discussed, it is unclear if this condition is shingles, a viral condition, for a bacterial infection. Given the swelling and the atypical pattern, I have been more concerned about a bacterial infection for you. Take doxycycline twice a day as prescribed. You may also take acyclovir. Follow-up with ENT or your regular doctor. Return to the emergency department if you have worsening symptoms, worsening headache, fever, or other urgent concerns.

## 2015-06-18 NOTE — ED Provider Notes (Signed)
[facility] Emergency Department Provider Note  ____________________________________________  Time seen: 1945  I have reviewed the triage vital signs and the nursing notes.   HISTORY  Chief Complaint Pain, rash, left face    HPI Teresa Wise is a 39 y.o. female who began to have pain and swelling with a rash to the left side of her face just anterior to her here. She has some swelling in the area and reports she feels like her lymph nodes down in the left neck are enlarged as well. She has moderate pain. She presents reporting she thinks this is shingles. She has had 3 or 4 prior episodes of shingles, but the patient reports that the lesions are always on the right side of the body. This is included lesions on her right torso and on another occasion on her right face similar to what she has today.  She does report feeling fatigued and having a headache. She denies any nausea or vomiting.   Past Medical History  Diagnosis Date  . Anxiety   . Depressed   . Ovarian cyst   . History of shingles     There are no active problems to display for this patient.   Past Surgical History  Procedure Laterality Date  . Appendectomy    . Tubal ligation Bilateral   . Tubal ligation      Current Outpatient Rx  Name  Route  Sig  Dispense  Refill  . acyclovir (ZOVIRAX) 200 MG capsule   Oral   Take 2 capsules (400 mg total) by mouth 4 (four) times daily.   40 capsule   0   . ALPRAZolam (XANAX) 1 MG tablet   Oral   Take 1 mg by mouth 4 (four) times daily.         Marland Kitchen. doxycycline (VIBRA-TABS) 100 MG tablet   Oral   Take 1 tablet (100 mg total) by mouth once.   14 tablet   0   . FLUoxetine (PROZAC) 40 MG capsule   Oral   Take 40 mg by mouth daily.         Marland Kitchen. ibuprofen (ADVIL,MOTRIN) 800 MG tablet   Oral   Take 1 tablet (800 mg total) by mouth every 8 (eight) hours as needed.   30 tablet   0   . methocarbamol (ROBAXIN) 500 MG tablet   Oral   Take 1  tablet (500 mg total) by mouth every 6 (six) hours as needed for muscle spasms.   30 tablet   0   . ondansetron (ZOFRAN) 4 MG tablet   Oral   Take 1 tablet (4 mg total) by mouth every 8 (eight) hours as needed for nausea or vomiting.   15 tablet   0   . oxyCODONE-acetaminophen (PERCOCET/ROXICET) 5-325 MG tablet   Oral   Take 1 tablet by mouth every 6 (six) hours as needed for severe pain.   16 tablet   0   . oxyCODONE-acetaminophen (ROXICET) 5-325 MG tablet   Oral   Take 1-2 tablets by mouth every 4 (four) hours as needed for severe pain.   15 tablet   0     Allergies Review of patient's allergies indicates no known allergies.  No family history on file.  Social History Social History  Substance Use Topics  . Smoking status: Current Every Day Smoker -- 0.50 packs/day    Types: Cigarettes  . Smokeless tobacco: Never Used  . Alcohol Use: No  Review of Systems  Constitutional: Negative for fever. Positive for general fatigue. ENT: Negative for sore throat. Cardiovascular: Negative for chest pain. Respiratory: Negative for cough. Gastrointestinal: Negative for abdominal pain, vomiting and diarrhea. Genitourinary: Negative for dysuria. Musculoskeletal: No myalgias or injuries. Skin: The sixth her lesion anterior to the left ear. See history of present illness Neurological: Denies focal weakness. Positive for headache.   10-point ROS otherwise negative.  ____________________________________________   PHYSICAL EXAM:  VITAL SIGNS: ED Triage Vitals  Enc Vitals Group     BP 06/18/15 1837 119/74 mmHg     Pulse --      Resp 06/18/15 1837 16     Temp 06/18/15 1837 98.6 F (37 C)     Temp Source 06/18/15 1837 Oral     SpO2 06/18/15 1837 98 %     Weight 06/18/15 1837 164 lb (74.39 kg)     Height 06/18/15 1837 5' (1.524 m)     Head Cir --      Peak Flow --      Pain Score 06/18/15 1843 10     Pain Loc --      Pain Edu? --      Excl. in GC? --      Constitutional: Alert and oriented. Appears a little uncomfortable but in no acute distress. ENT   Head: Normocephalic and atraumatic.   Nose: No congestion/rhinnorhea.        Mouth: No erythema, no swelling.      Ears: Mild tenderness on her exam. No erythema or abnormality within the canal. The patient does have some diffuse swelling anterior to the left ear. This is the area where she has a few scattered vesicles. These vesicles appear white to clear. There is no erythema. Neck: Mild to moderate tenderness in the left lateral portion of the neck extending down to the sternocleidomastoid.  Cardiovascular: Normal rate, regular rhythm, no murmur noted Respiratory:  Normal respiratory effort, no tachypnea.    Breath sounds are clear and equal bilaterally.  Gastrointestinal: Soft and nontender. No distention.  Musculoskeletal: No deformity noted. Nontender with normal range of motion in all extremities.  No noted edema. Neurologic:  Normal speech and language. No gross focal neurologic deficits are appreciated.  Skin:  Skin is warm, dry. Patient has a vesicular rash anterior to the left ear. See the Commons above. Psychiatric: Mood and affect are normal. Speech and behavior are normal.  ____________________________________________   INITIAL IMPRESSION / ASSESSMENT AND PLAN / ED COURSE  Pertinent labs & imaging results that were available during my care of the patient were reviewed by me and considered in my medical decision making (see chart for details).  Pleasant communicative 39 year old female complains of a headache with the vesicular lesion and some swelling in the left periauricular area. There is no erythema. There is some swelling. My suspicion is that this is not shingles about other a bacterial infection. She has been treated with antibiotics previously for the episodes that she reports having shingles area at this time, it is not possible to determine if this is following a  dermatomal distribution. With the swelling she has, on more concerned about a bacterial infection. We'll start her on doxycycline. Given the patient's concern for shingles, the patient agreed to have her take acyclovir as well. I've asked her to follow with ENT given the area of the infection.  We'll treat her pain with Percocet.  ____________________________________________   FINAL CLINICAL IMPRESSION(S) / ED DIAGNOSES  Final  diagnoses:  Vesicular skin lesions  Left facial pain      Darien Ramus, MD 06/18/15 2008

## 2015-06-18 NOTE — ED Notes (Signed)
Pt with shingles to left side of face with swollen lymhnodes.

## 2015-06-19 ENCOUNTER — Telehealth: Payer: Self-pay | Admitting: Emergency Medicine

## 2015-06-19 NOTE — ED Notes (Signed)
Pharmacy called to clarify doxycycline dosing.  Per dr Inocencio Homesgayle, it should be twice daily

## 2016-01-01 ENCOUNTER — Other Ambulatory Visit: Payer: Self-pay

## 2016-01-01 ENCOUNTER — Emergency Department

## 2016-01-01 ENCOUNTER — Emergency Department
Admission: EM | Admit: 2016-01-01 | Discharge: 2016-01-01 | Disposition: A | Discharge status: Home / Self Care | Attending: Emergency Medicine | Admitting: Emergency Medicine

## 2016-01-01 ENCOUNTER — Encounter: Payer: Self-pay | Admitting: Emergency Medicine

## 2016-01-01 DIAGNOSIS — Z79899 Other long term (current) drug therapy: Secondary | ICD-10-CM | POA: Insufficient documentation

## 2016-01-01 DIAGNOSIS — F17200 Nicotine dependence, unspecified, uncomplicated: Secondary | ICD-10-CM | POA: Insufficient documentation

## 2016-01-01 DIAGNOSIS — M62838 Other muscle spasm: Secondary | ICD-10-CM | POA: Insufficient documentation

## 2016-01-01 DIAGNOSIS — F329 Major depressive disorder, single episode, unspecified: Secondary | ICD-10-CM | POA: Insufficient documentation

## 2016-01-01 MED ORDER — DIAZEPAM 2 MG PO TABS: 2.0000 mg | ORAL_TABLET | Freq: Three times a day (TID) | ORAL | Status: DC | PRN

## 2016-01-01 MED ORDER — HYDROCODONE-ACETAMINOPHEN 5-325 MG PO TABS: 1.0000 | ORAL_TABLET | ORAL | Status: DC | PRN

## 2016-01-01 MED ORDER — DIAZEPAM 2 MG PO TABS
2.0000 mg | ORAL_TABLET | Freq: Once | ORAL | Status: AC
  Administered 2016-01-01: 2 mg via ORAL
  Filled 2016-01-01: qty 1

## 2016-01-01 MED ORDER — KETOROLAC TROMETHAMINE 60 MG/2ML IM SOLN
60.0000 mg | Freq: Once | INTRAMUSCULAR | Status: AC
Start: 2016-01-01 — End: 2016-01-01
  Administered 2016-01-01: 60 mg via INTRAMUSCULAR
  Filled 2016-01-01: qty 2

## 2016-01-01 NOTE — Discharge Instructions (Signed)
Heat Therapy °Heat therapy can help ease sore, stiff, injured, and tight muscles and joints. Heat relaxes your muscles, which may help ease your pain. Heat therapy should only be used on old, pre-existing, or long-lasting (chronic) injuries. Do not use heat therapy unless told by your doctor. °HOW TO USE HEAT THERAPY °There are several different kinds of heat therapy, including: °· Moist heat pack. °· Warm water bath. °· Hot water bottle. °· Electric heating pad. °· Heated gel pack. °· Heated wrap. °· Electric heating pad. °GENERAL HEAT THERAPY RECOMMENDATIONS  °· Do not sleep while using heat therapy. Only use heat therapy while you are awake. °· Your skin may turn pink while using heat therapy. Do not use heat therapy if your skin turns red. °· Do not use heat therapy if you have new pain. °· High heat or long exposure to heat can cause burns. Be careful when using heat therapy to avoid burning your skin. °· Do not use heat therapy on areas of your skin that are already irritated, such as with a rash or sunburn. °GET HELP IF:  °· You have blisters, redness, swelling (puffiness), or numbness. °· You have new pain. °· Your pain is worse. °MAKE SURE YOU: °· Understand these instructions. °· Will watch your condition. °· Will get help right away if you are not doing well or get worse. °  °This information is not intended to replace advice given to you by your health care provider. Make sure you discuss any questions you have with your health care provider. °  °Document Released: 11/03/2011 Document Revised: 09/01/2014 Document Reviewed: 10/04/2013 °Elsevier Interactive Patient Education ©2016 [employer] ° °

## 2016-01-01 NOTE — ED Notes (Signed)
States she has had pain to left side of neck and head since May 1st.. States she had a fall on April 30th  After getting some bad news.. Increased pain with palpation and turning her head no fever or drainage

## 2016-01-01 NOTE — ED Provider Notes (Signed)
[facility] Emergency Department Provider Note   ____________________________________________  Time seen: Approximately 8:44 AM  I have reviewed the triage vital signs and the nursing notes.   HISTORY  Chief Complaint Otalgia  HPI Teresa Wise is a 40 y.o. female, NAD, presents to the emergency department today with 10 day history of a fall. She received news about her mother's passing on April 30th and fell awkwardly into her friend's arms. She states the next morning her neck was very stiff and sore. She has been out of town for the past several days and has not been able to go to the doctor for the injury. She states it is very painful to turn her head to the right. She has been taking tylenol and ibuprofen for the pain with no relief. Denies other injury at this time. Pain is 8 out of 10.   Past Medical History  Diagnosis Date  . Anxiety   . Depressed   . Ovarian cyst   . History of shingles     There are no active problems to display for this patient.   Past Surgical History  Procedure Laterality Date  . Appendectomy    . Tubal ligation Bilateral   . Tubal ligation      Current Outpatient Rx  Name  Route  Sig  Dispense  Refill  . ALPRAZolam (XANAX) 1 MG tablet   Oral   Take 1 mg by mouth 4 (four) times daily.         . diazepam (VALIUM) 2 MG tablet   Oral   Take 1 tablet (2 mg total) by mouth every 8 (eight) hours as needed for muscle spasms.   9 tablet   0   . FLUoxetine (PROZAC) 40 MG capsule   Oral   Take 40 mg by mouth daily.         Marland Kitchen. HYDROcodone-acetaminophen (NORCO/VICODIN) 5-325 MG tablet   Oral   Take 1 tablet by mouth every 4 (four) hours as needed for moderate pain.   20 tablet   0     Allergies Review of patient's allergies indicates no known allergies.  No family history on file.  Social History Social History  Substance Use Topics  . Smoking status: Current Every Day Smoker -- 0.50 packs/day   Types: Cigarettes  . Smokeless tobacco: Never Used  . Alcohol Use: No    Review of Systems Constitutional: No fever/chills Eyes: No visual changes. ENT: No sore throat. No otalgia. Cardiovascular: Denies chest pain. Respiratory: Denies shortness of breath. Gastrointestinal: No abdominal pain.  No nausea, no vomiting.  No diarrhea.  No constipation. Genitourinary: Negative for dysuria. Musculoskeletal: Positive for left sided neck pain. Negative for back pain. Skin: Negative for rash. Neurological: Negative for headaches, focal weakness or numbness.  ____________________________________________   PHYSICAL EXAM:  VITAL SIGNS: ED Triage Vitals  Enc Vitals Group     BP 01/01/16 0841 121/75 mmHg     Pulse Rate 01/01/16 0841 104     Resp 01/01/16 0841 18     Temp 01/01/16 0841 98 F (36.7 C)     Temp Source 01/01/16 0841 Oral     SpO2 01/01/16 0841 99 %     Weight 01/01/16 0841 160 lb (72.576 kg)     Height 01/01/16 0841 5' (1.524 m)     Head Cir --      Peak Flow --      Pain Score 01/01/16 0834 8  Pain Loc --      Pain Edu? --      Excl. in GC? --    Constitutional: Alert and oriented. Well appearing and in no acute distress. Eyes: Conjunctivae are normal. EOMI. Head: Atraumatic. Nose: No congestion/rhinnorhea. Mouth/Throat: Mucous membranes are moist.  Oropharynx non-erythematous. Neck: No stridor. No cervical spine tenderness to palpation. Hematological/Lymphatic/Immunilogical: No cervical lymphadenopathy. Cardiovascular: Normal rate, regular rhythm. Grossly normal heart sounds.  Good peripheral circulation. Respiratory: Normal respiratory effort.  No retractions. Lungs CTAB. Gastrointestinal: Soft and nontender. No distention. No abdominal bruits. No CVA tenderness. Musculoskeletal: Full ROM in neck, moderate pain with rotation of neck to the right. TTP along the left SCM and trapezius. No lower extremity tenderness nor edema.  No joint effusions. Neurologic:   Normal speech and language. No gross focal neurologic deficits are appreciated. No gait instability. Skin:  Skin is warm, dry and intact. No rash noted. Psychiatric: Mood and affect are normal. Speech and behavior are normal.   RADIOLOGY  CERVICAL SPINE - 2-3 VIEW  COMPARISON: None in PACs  FINDINGS: There is mild loss of the normal cervical lordosis. The cervical vertebral bodies are preserved in height. There is mild disc space narrowing at C4-5. Are small anterior endplate osteophytes at this level. There is no perched facet or spinous process fracture. The odontoid is intact. The prevertebral soft tissue spaces are normal.  IMPRESSION: Mild reversal of the normal cervical lordosis consistent with muscle spasm. Mild degenerative disc space narrowing at C4-5. No acute fracture nor dislocation is observed. ____________________________________________   PROCEDURES  Procedure(s) performed: None  Critical Care performed: No  ____________________________________________   INITIAL IMPRESSION / ASSESSMENT AND PLAN / ED COURSE  Pertinent labs & imaging results that were available during my care of the patient were reviewed by me and considered in my medical decision making (see chart for details).  Cervical paraspinal muscle spasm. Discharged home with valium and norco. Advised to continue to perform ROM exercises with her neck to help ease the pain. ____________________________________________   FINAL CLINICAL IMPRESSION(S) / ED DIAGNOSES  Final diagnoses:  Cervical paraspinal muscle spasm      NEW MEDICATIONS STARTED DURING THIS VISIT:  Discharge Medication List as of 01/01/2016  9:49 AM    START taking these medications   Details  diazepam (VALIUM) 2 MG tablet Take 1 tablet (2 mg total) by mouth every 8 (eight) hours as needed for muscle spasms., Starting 01/01/2016, Until Discontinued, Print    HYDROcodone-acetaminophen (NORCO/VICODIN) 5-325 MG tablet Take 1  tablet by mouth every 4 (four) hours as needed for moderate pain., Starting 01/01/2016, Until Discontinued, Print         Note:  This document was prepared using Dragon voice recognition software and may include unintentional dictation errors.    Tommi Rumps, PA-C 01/01/16 1011

## 2016-01-01 NOTE — ED Notes (Signed)
Reports left ear pain since may 1st

## 2016-03-19 ENCOUNTER — Emergency Department
Admission: EM | Admit: 2016-03-19 | Discharge: 2016-03-19 | Disposition: A | Discharge status: Home / Self Care | Attending: Emergency Medicine | Admitting: Emergency Medicine

## 2016-03-19 ENCOUNTER — Encounter: Payer: Self-pay | Admitting: Emergency Medicine

## 2016-03-19 DIAGNOSIS — F17210 Nicotine dependence, cigarettes, uncomplicated: Secondary | ICD-10-CM | POA: Insufficient documentation

## 2016-03-19 DIAGNOSIS — B029 Zoster without complications: Secondary | ICD-10-CM | POA: Insufficient documentation

## 2016-03-19 MED ORDER — ACYCLOVIR 800 MG PO TABS: 800.0000 mg | ORAL_TABLET | Freq: Every day | ORAL | 0 refills | Status: DC

## 2016-03-19 MED ORDER — OXYCODONE-ACETAMINOPHEN 5-325 MG PO TABS: 1.0000 | ORAL_TABLET | Freq: Four times a day (QID) | ORAL | 0 refills | Status: DC | PRN

## 2016-03-19 NOTE — ED Notes (Signed)
Pt verbalized understanding of discharge instructions. NAD at this time. 

## 2016-03-19 NOTE — ED Triage Notes (Addendum)
Pt presents to ED with painful blistered rash to lower abdomen. Pt reports fatigue, headache, diarrhea and right side pain for two days. Pt reports having varicella as a child. Pt reports history of shingles.

## 2016-03-19 NOTE — ED Provider Notes (Signed)
[facility] Emergency Department Provider Note  ____________________________________________  Time seen: Approximately 6:40 PM  I have reviewed the triage vital signs and the nursing notes.   HISTORY  Chief Complaint Herpes Zoster    HPI Teresa Wise is a 40 y.o. female who presents to emergency department complaining of burning/stabbing pain in dermatomal distribution on right lower abdomen. Patient also reports a blistering rash conforming to right lower abdominal wall. Patient states that she has a history of shingles and that the symptoms are consistent with previous outbreaks. Patient also reports a mild headache as well as some abdominal pain and diarrhea. Patient reports that in the past with her shingles outbreaks that symptoms of headache, abdominal pain, and diarrhea have been present. Patient denies any fevers or chills, nausea or vomiting. She denies seeing any blood in the diarrhea. Patient is not tried any medications for this complaint prior to arrival. Patient reports that burning sensation in dermatomal distribution is severe.   Past Medical History:  Diagnosis Date  . Anxiety   . Depressed   . History of shingles   . Ovarian cyst     There are no active problems to display for this patient.   Past Surgical History:  Procedure Laterality Date  . APPENDECTOMY    . TUBAL LIGATION Bilateral   . TUBAL LIGATION      Prior to Admission medications   Medication Sig Start Date End Date Taking? Authorizing Provider  acyclovir (ZOVIRAX) 800 MG tablet Take 1 tablet (800 mg total) by mouth 5 (five) times daily. 03/19/16   Delorise Royals Deloris Mittag, PA-C  ALPRAZolam Prudy Feeler) 1 MG tablet Take 1 mg by mouth 4 (four) times daily.    Historical Provider, MD  diazepam (VALIUM) 2 MG tablet Take 1 tablet (2 mg total) by mouth every 8 (eight) hours as needed for muscle spasms. 01/01/16   Tommi Rumps, PA-C  FLUoxetine (PROZAC) 40 MG capsule Take 40 mg by mouth  daily.    Historical Provider, MD  HYDROcodone-acetaminophen (NORCO/VICODIN) 5-325 MG tablet Take 1 tablet by mouth every 4 (four) hours as needed for moderate pain. 01/01/16   Tommi Rumps, PA-C  oxyCODONE-acetaminophen (ROXICET) 5-325 MG tablet Take 1 tablet by mouth every 6 (six) hours as needed for severe pain. 03/19/16   Delorise Royals Marticia Reifschneider, PA-C    Allergies Review of patient's allergies indicates no known allergies.  No family history on file.  Social History Social History  Substance Use Topics  . Smoking status: Current Every Day Smoker    Packs/day: 0.50    Types: Cigarettes  . Smokeless tobacco: Never Used  . Alcohol use No     Review of Systems  Constitutional: No fever/chills Cardiovascular: no chest pain. Respiratory: no cough. No SOB. Gastrointestinal: Positive for generalized abdominal pain.  No nausea, no vomiting.  Positive for diarrhea.  No constipation. Musculoskeletal: Negative for musculoskeletal pain. Skin: Positive for blistering lesions to the right lower abdominal wall with burning in taking in dermatomal distribution. Neurological: Negative for headaches, focal weakness or numbness. 10-point ROS otherwise negative.  ____________________________________________   PHYSICAL EXAM:  VITAL SIGNS: ED Triage Vitals [03/19/16 1822]  Enc Vitals Group     BP 125/74     Pulse Rate (!) 116     Resp 18     Temp 98.2 F (36.8 C)     Temp Source Oral     SpO2 98 %     Weight 159 lb (72.1 kg)  Height 5' (1.524 m)     Head Circumference      Peak Flow      Pain Score 10     Pain Loc      Pain Edu?      Excl. in GC?      Constitutional: Alert and oriented. Well appearing and in no acute distress. Eyes: Conjunctivae are normal. PERRL. EOMI. Head: Atraumatic. Neck: No stridor.   Hematological/Lymphatic/Immunilogical: No cervical lymphadenopathy. Cardiovascular: Normal rate, regular rhythm. Normal S1 and S2.  Good peripheral  circulation. Respiratory: Normal respiratory effort without tachypnea or retractions. Lungs CTAB. Good air entry to the bases with no decreased or absent breath sounds. Gastrointestinal: Bowel sounds 4 quadrants. Mild tenderness to palpation in bilateral lower quadrants. No rebound tenderness. Rovsing's and obturator's is negative.. Soft to palpation. No guarding or rigidity. No palpable masses. No distention.  Musculoskeletal: Full range of motion to all extremities. No gross deformities appreciated. Neurologic:  Normal speech and language. No gross focal neurologic deficits are appreciated.  Skin:  Skin is warm, dry and intact. Fascicular lesions noted to right lower abdominal wall. Patient is tender to palpation in dermatomal distribution towards back. No rash noted to back. Psychiatric: Mood and affect are normal. Speech and behavior are normal. Patient exhibits appropriate insight and judgement.   ____________________________________________   LABS (all labs ordered are listed, but only abnormal results are displayed)  Labs Reviewed - No data to display ____________________________________________  EKG   ____________________________________________  RADIOLOGY   No results found.  ____________________________________________    PROCEDURES  Procedure(s) performed:    Procedures    Medications - No data to display   ____________________________________________   INITIAL IMPRESSION / ASSESSMENT AND PLAN / ED COURSE  Pertinent labs & imaging results that were available during my care of the patient were reviewed by me and considered in my medical decision making (see chart for details).  Clinical Course    Patient's diagnosis is consistent with Shingles. Patient also endorses some generalized abdominal pain, diarrhea, headache. Patient states that this is consistent with previous shingles outbreaks. Labs and imaging are offered to patient and she declined at  this time. Patient is instructed to return to the emergency department for any worsening abdominal pain, diarrhea, nausea or vomiting, fevers or chills, worsening headache. Patient verbalizes understanding and states that she will comply. Patient states that at this time she "just wants medication for shingles. I don't need any other testing.". Patient will be discharged home with prescriptions for antivirals and pain medication. Patient is to follow up with primary care provider for further treatment of shingles and neuropathic pain as needed or otherwise directed. Patient is given ED precautions to return to the ED for any worsening or new symptoms.     ____________________________________________  FINAL CLINICAL IMPRESSION(S) / ED DIAGNOSES  Final diagnoses:  Shingles outbreak      NEW MEDICATIONS STARTED DURING THIS VISIT:  New Prescriptions   ACYCLOVIR (ZOVIRAX) 800 MG TABLET    Take 1 tablet (800 mg total) by mouth 5 (five) times daily.   OXYCODONE-ACETAMINOPHEN (ROXICET) 5-325 MG TABLET    Take 1 tablet by mouth every 6 (six) hours as needed for severe pain.        This chart was dictated using voice recognition software/Dragon. Despite best efforts to proofread, errors can occur which can change the meaning. Any change was purely unintentional.    Racheal Patches, PA-C 03/19/16 1854    Governor Rooks, MD 03/19/16  2301  

## 2016-04-21 ENCOUNTER — Encounter: Payer: Self-pay | Admitting: Emergency Medicine

## 2016-04-21 ENCOUNTER — Emergency Department
Admission: EM | Admit: 2016-04-21 | Discharge: 2016-04-22 | Disposition: A | Discharge status: Home / Self Care | Attending: Student in an Organized Health Care Education/Training Program | Admitting: Student in an Organized Health Care Education/Training Program

## 2016-04-21 DIAGNOSIS — F329 Major depressive disorder, single episode, unspecified: Secondary | ICD-10-CM | POA: Insufficient documentation

## 2016-04-21 DIAGNOSIS — Z79899 Other long term (current) drug therapy: Secondary | ICD-10-CM | POA: Insufficient documentation

## 2016-04-21 DIAGNOSIS — F332 Major depressive disorder, recurrent severe without psychotic features: Secondary | ICD-10-CM

## 2016-04-21 DIAGNOSIS — F32A Depression, unspecified: Secondary | ICD-10-CM

## 2016-04-21 DIAGNOSIS — R45851 Suicidal ideations: Secondary | ICD-10-CM | POA: Insufficient documentation

## 2016-04-21 DIAGNOSIS — F17210 Nicotine dependence, cigarettes, uncomplicated: Secondary | ICD-10-CM | POA: Insufficient documentation

## 2016-04-21 LAB — CBC
HEMATOCRIT: 45.3 % (ref 35.0–47.0)
Hemoglobin: 15.2 g/dL (ref 12.0–16.0)
MCH: 29.8 pg (ref 26.0–34.0)
MCHC: 33.6 g/dL (ref 32.0–36.0)
MCV: 88.6 fL (ref 80.0–100.0)
Platelets: 376 10*3/uL (ref 150–440)
RBC: 5.11 MIL/uL (ref 3.80–5.20)
RDW: 13.4 % (ref 11.5–14.5)
WBC: 11.5 10*3/uL — ABNORMAL HIGH (ref 3.6–11.0)

## 2016-04-21 LAB — SALICYLATE LEVEL: Salicylate Lvl: <4 mg/dL (ref 2.8–30.0)

## 2016-04-21 LAB — COMPREHENSIVE METABOLIC PANEL
ALBUMIN: 4 g/dL (ref 3.5–5.0)
ALK PHOS: 58 U/L (ref 38–126)
ALT: 18 U/L (ref 14–54)
AST: 17 U/L (ref 15–41)
Anion gap: 7 (ref 5–15)
BILIRUBIN TOTAL: 0.2 mg/dL — AB (ref 0.3–1.2)
BUN: 10 mg/dL (ref 6–20)
CALCIUM: 9.1 mg/dL (ref 8.9–10.3)
CO2: 25 mmol/L (ref 22–32)
Chloride: 112 mmol/L — ABNORMAL HIGH (ref 101–111)
Creatinine, Ser: 0.89 mg/dL (ref 0.44–1.00)
GFR calc Af Amer: >60 mL/min (ref >60)
GFR calc non Af Amer: >60 mL/min (ref >60)
GLUCOSE: 72 mg/dL (ref 65–99)
Potassium: 3.6 mmol/L (ref 3.5–5.1)
Sodium: 144 mmol/L (ref 135–145)
TOTAL PROTEIN: 7.1 g/dL (ref 6.5–8.1)

## 2016-04-21 LAB — URINE DRUG SCREEN, QUALITATIVE (ARMC ONLY)
Amphetamines, Ur Screen: NOT DETECTED
BENZODIAZEPINE, UR SCRN: POSITIVE — AB
Barbiturates, Ur Screen: NOT DETECTED
CANNABINOID 50 NG, UR ~~LOC~~: NOT DETECTED
Cocaine Metabolite,Ur ~~LOC~~: NOT DETECTED
MDMA (Ecstasy)Ur Screen: NOT DETECTED
Methadone Scn, Ur: NOT DETECTED
OPIATE, UR SCREEN: NOT DETECTED
PHENCYCLIDINE (PCP) UR S: NOT DETECTED
Tricyclic, Ur Screen: NOT DETECTED

## 2016-04-21 LAB — ETHANOL

## 2016-04-21 LAB — ACETAMINOPHEN LEVEL: Acetaminophen (Tylenol), Serum: <10 ug/mL — ABNORMAL LOW (ref 10–30)

## 2016-04-21 LAB — POCT PREGNANCY, URINE: Preg Test, Ur: NEGATIVE

## 2016-04-21 MED ORDER — LORAZEPAM 1 MG PO TABS
1.0000 mg | ORAL_TABLET | Freq: Once | ORAL | Status: AC
  Administered 2016-04-21: 1 mg via ORAL
  Filled 2016-04-21: qty 1

## 2016-04-21 MED ORDER — SODIUM CHLORIDE 0.9 % IV BOLUS (SEPSIS)
1000.0000 mL | Freq: Once | INTRAVENOUS | Status: AC
  Administered 2016-04-21: 1000 mL via INTRAVENOUS

## 2016-04-21 NOTE — ED Notes (Signed)
Patient tearful on admission stating, "I feel like I'm in jail."  Patient currently denies SI. Patient oriented to unit.  No distress noted.

## 2016-04-21 NOTE — BH Assessment (Signed)
Assessment Note  Teresa Wise is an 40 y.o. female. Ms. Teresa Wise arrived to the ED by way of her personal vehicle.  She reports that she is "really depressed".  She reports that this has been a extremely hard year.  She states that she is having marital problems and she lost her mother to a drug overdose this year.  She reports having an argument with her husband today and being overwhelmed by the event.  She reports tha she is facing financial issues. She reports symptoms of anxiety. She reports that she gets shaky, light headed, tingling in hands and feet, and difficulty breathing.  She reports that this happens "all the time". She denied having auditory or visual hallucinations.  She reports suicidal ideation. [name] states that she had a prior suicide attempt in January.  She reports that she has overdosed in the past, and would use a pistol.  She denied homicidal ideation or intent.  She denied the use of alcohol or drugs. She reports "I have been through a lot this year".  Diagnosis: Depression, anxiety  Past Medical History:  Past Medical History:  Diagnosis Date  . Anxiety   . Depressed   . History of shingles   . Ovarian cyst     Past Surgical History:  Procedure Laterality Date  . APPENDECTOMY    . TUBAL LIGATION Bilateral   . TUBAL LIGATION      Family History: No family history on file.  Social History:  reports that she has been smoking Cigarettes.  She has been smoking about 0.50 packs per day. She has never used smokeless tobacco. She reports that she does not drink alcohol or use drugs.  Additional Social History:  Alcohol / Drug Use History of alcohol / drug use?: No history of alcohol / drug abuse  CIWA: CIWA-Ar BP: (!) 121/99 Pulse Rate: (!) 107 COWS:    Allergies: No Known Allergies  Home Medications:  (Not in a hospital admission)  OB/GYN Status:  Patient's last menstrual period was 04/09/2016.  General Assessment Data [facility]  Assessment: In system Is this a Tele or Face-to-Face Assessment?: Face-to-Face Is this an Initial Assessment or a Re-assessment for this encounter?: Initial Assessment Marital status: Married EssigMaiden name: [name] Is patient pregnant?: No Pregnancy Status: No Living Arrangements: Spouse/significant other, Children Can pt return to current living arrangement?: Yes Admission Status: Voluntary Is patient capable of signing voluntary admission?: Yes Referral Source: Self/Family/Friend Insurance type: Tricare (unsure if active)  Medical Screening Exam [facility] Walk-in ONLY) Medical Exam completed: Yes  Crisis Care Plan Living Arrangements: Spouse/significant other, Children Legal Guardian: Other: (Self) Name of Psychiatrist: Dr. Unknown FoleyMark Moffit Name of Therapist: Harriett SineNancy ([facility])  Education Status Is patient currently in school?: No Current Grade: n/a Highest grade of school patient has completed: 12th Name of [employer]: [name] Contact person: n/a  Risk to self with the past 6 months Suicidal Ideation: Yes-Currently Present Has patient been a risk to self within the past 6 months prior to admission? : Yes Suicidal Intent: Yes-Currently Present Has patient had any suicidal intent within the past 6 months prior to admission? : Yes Is patient at risk for suicide?: Yes Suicidal Plan?: Yes-Currently Present Has patient had any suicidal plan within the past 6 months prior to admission? : Yes Specify Current Suicidal Plan: use a pistol Access to Means: No (Currently in the hospital) What has been your use of drugs/alcohol within the last 12 months?: denied use Previous Attempts/Gestures: Yes  How many times?: 1 Other Self Harm Risks: denied Triggers for Past Attempts:  (Social anxiety) Intentional Self Injurious Behavior: None Family Suicide History: No Recent stressful life event(s): Conflict (Comment), Financial Problems (family conflict) Persecutory voices/beliefs?: No Depression:  Yes Depression Symptoms: Despondent, Tearfulness, Feeling worthless/self pity Substance abuse history and/or treatment for substance abuse?: No Suicide prevention information given to non-admitted patients: Not applicable  Risk to Others within the past 6 months Homicidal Ideation: No Does patient have any lifetime risk of violence toward others beyond the six months prior to admission? : No Thoughts of Harm to Others: No Current Homicidal Intent: No Current Homicidal Plan: No Access to Homicidal Means: No Identified Victim: None identified History of harm to others?: No Assessment of Violence: None Noted Violent Behavior Description: denied Does patient have access to weapons?: No Criminal Charges Pending?: No Does patient have a court date: No Is patient on probation?: No  Psychosis Hallucinations: None noted Delusions: None noted  Mental Status Report Appearance/Hygiene: In scrubs Eye Contact: Poor Motor Activity: Unremarkable Speech: Soft Level of Consciousness: Alert Mood: Depressed Affect: Sad Anxiety Level: Minimal Thought Processes: Coherent Judgement: Unimpaired Orientation: Person, Place, Time, Situation Obsessive Compulsive Thoughts/Behaviors: None  Cognitive Functioning Concentration: Normal Memory: Recent Intact IQ: Average Insight: Fair Impulse Control: Fair Appetite: Good Sleep: Decreased Vegetative Symptoms: None  ADLScreening [facility] Assessment Services) Patient's cognitive ability adequate to safely complete daily activities?: Yes Patient able to express need for assistance with ADLs?: Yes Independently performs ADLs?: Yes (appropriate for developmental age)  Prior Inpatient Therapy Prior Inpatient Therapy: No Prior Therapy Dates: n/a Prior Therapy Facilty/Provider(s): n/a Reason for Treatment: n/a  Prior Outpatient Therapy Prior Outpatient Therapy: Yes Prior Therapy Dates: Current Prior Therapy Facilty/Provider(s): [facility] Reason for  Treatment: PTSD, Depression, Anxiety Does patient have an ACCT team?: No Does patient have Intensive In-House Services?  : No Does patient have [facility] services? : No Does patient have P4CC services?: No  ADL Screening (condition at time of admission) Patient's cognitive ability adequate to safely complete daily activities?: Yes Patient able to express need for assistance with ADLs?: Yes Independently performs ADLs?: Yes (appropriate for developmental age)       Abuse/Neglect Assessment (Assessment to be complete while patient is alone) Physical Abuse: Denies Verbal Abuse: Denies Sexual Abuse: Denies Exploitation of patient/patient's resources: Denies Self-Neglect: Denies     Merchant navy officer (For Healthcare) Does patient have an advance directive?: No Would patient like information on creating an advanced directive?: No - patient declined information    Additional Information 1:1 In Past 12 Months?: No CIRT Risk: No Elopement Risk: No Does patient have medical clearance?: Yes     Disposition:  Disposition Initial Assessment Completed for this Encounter: Yes Disposition of Patient: Other dispositions  On Site Evaluation by:   Reviewed with Physician:    Justice Deeds 04/21/2016 9:50 PM

## 2016-04-21 NOTE — ED Provider Notes (Signed)
[facility] Emergency Department Provider Note    First MD Initiated Contact with Patient 04/21/16 [phone]     (approximate)  I have reviewed the triage vital signs and the nursing notes.   HISTORY  Chief Complaint Psychiatric Evaluation    HPI Teresa Wise is a 40 y.o. female history of anxiety and depression presents with active suicidal ideation with plan to go home and shoot herself with her husband's gun. Patient states that she is undergoing a lot of stressors do to strained relationship at home including work. Denies any substance abuse. No recent headaches or fevers. States that she was told she had a history of PTSD but will not expound upon that history. The patient was hemodynamically in triage stated that she wanted to leave she did not want to wait for psychiatric evaluation continues and that she had a SI therefore the patient was made emergent IVC due to concern for self-harm.   Past Medical History:  Diagnosis Date  . Anxiety   . Depressed   . History of shingles   . Ovarian cyst     There are no active problems to display for this patient.   Past Surgical History:  Procedure Laterality Date  . APPENDECTOMY    . TUBAL LIGATION Bilateral   . TUBAL LIGATION      Prior to Admission medications   Medication Sig Start Date End Date Taking? Authorizing Provider  acyclovir (ZOVIRAX) 800 MG tablet Take 1 tablet (800 mg total) by mouth 5 (five) times daily. 03/19/16   Delorise Royals Cuthriell, PA-C  ALPRAZolam Prudy Feeler) 1 MG tablet Take 1 mg by mouth 4 (four) times daily.    Historical Provider, MD  diazepam (VALIUM) 2 MG tablet Take 1 tablet (2 mg total) by mouth every 8 (eight) hours as needed for muscle spasms. 01/01/16   Tommi Rumps, PA-C  FLUoxetine (PROZAC) 40 MG capsule Take 40 mg by mouth daily.    Historical Provider, MD  HYDROcodone-acetaminophen (NORCO/VICODIN) 5-325 MG tablet Take 1 tablet by mouth every 4 (four) hours as needed for  moderate pain. 01/01/16   Tommi Rumps, PA-C  oxyCODONE-acetaminophen (ROXICET) 5-325 MG tablet Take 1 tablet by mouth every 6 (six) hours as needed for severe pain. 03/19/16   Delorise Royals Cuthriell, PA-C    Allergies Review of patient's allergies indicates no known allergies.  No family history on file.  Social History Social History  Substance Use Topics  . Smoking status: Current Every Day Smoker    Packs/day: 0.50    Types: Cigarettes  . Smokeless tobacco: Never Used  . Alcohol use No    Review of Systems Patient denies headaches, rhinorrhea, blurry vision, numbness, shortness of breath, chest pain, edema, cough, abdominal pain, nausea, vomiting, diarrhea, dysuria, fevers, rashes or hallucinations unless otherwise stated above in HPI. ____________________________________________   PHYSICAL EXAM:  VITAL SIGNS: Vitals:   04/21/16 2045  BP: (!) 121/99  Pulse: (!) 107  Resp: 18  Temp: 98.3 F (36.8 C)    Constitutional: Tearful, no acute distress Eyes: Conjunctivae are normal. PERRL. EOMI. Head: Atraumatic. Nose: No congestion/rhinnorhea. Mouth/Throat: Mucous membranes are moist.  Oropharynx non-erythematous. Neck: No stridor. Painless ROM. No cervical spine tenderness to palpation Hematological/Lymphatic/Immunilogical: No cervical lymphadenopathy. Cardiovascular: Normal rate, regular rhythm. Grossly normal heart sounds.  Good peripheral circulation. Respiratory: Normal respiratory effort.  No retractions. Lungs CTAB. Gastrointestinal: Soft and nontender. No distention. No abdominal bruits. No CVA tenderness. Genitourinary:  Musculoskeletal: No lower extremity  tenderness nor edema.  No joint effusions. Neurologic:  Normal speech and language. No gross focal neurologic deficits are appreciated. No gait instability. Skin:  Skin is warm, dry and intact. No rash noted. Psychiatric: Tearful, depressed, withdrawn, normal speech pattern normal thought  process ____________________________________________   LABS (all labs ordered are listed, but only abnormal results are displayed)  Results for orders placed or performed during the hospital encounter of 04/21/16 (from the past 24 hour(s))  CBC     Status: Abnormal   Collection Time: 04/21/16  8:57 PM  Result Value Ref Range   WBC 11.5 (H) 3.6 - 11.0 K/uL   RBC 5.11 3.80 - 5.20 MIL/uL   Hemoglobin 15.2 12.0 - 16.0 g/dL   HCT 96.245.3 95.235.0 - 84.147.0 %   MCV 88.6 80.0 - 100.0 fL   MCH 29.8 26.0 - 34.0 pg   MCHC 33.6 32.0 - 36.0 g/dL   RDW 32.413.4 40.111.5 - 02.714.5 %   Platelets 376 150 - 440 K/uL  Comprehensive metabolic panel     Status: Abnormal   Collection Time: 04/21/16  8:57 PM  Result Value Ref Range   Sodium 144 135 - 145 mmol/L   Potassium 3.6 3.5 - 5.1 mmol/L   Chloride 112 (H) 101 - 111 mmol/L   CO2 25 22 - 32 mmol/L   Glucose, Bld 72 65 - 99 mg/dL   BUN 10 6 - 20 mg/dL   Creatinine, Ser 2.530.89 0.44 - 1.00 mg/dL   Calcium 9.1 8.9 - 66.410.3 mg/dL   Total Protein 7.1 6.5 - 8.1 g/dL   Albumin 4.0 3.5 - 5.0 g/dL   AST 17 15 - 41 U/L   ALT 18 14 - 54 U/L   Alkaline Phosphatase 58 38 - 126 U/L   Total Bilirubin 0.2 (L) 0.3 - 1.2 mg/dL   GFR calc non Af Amer >60 >60 mL/min   GFR calc Af Amer >60 >60 mL/min   Anion gap 7 5 - 15  Urine Drug Screen, Qualitative ([facility] only)     Status: Abnormal   Collection Time: 04/21/16  8:57 PM  Result Value Ref Range   Tricyclic, Ur Screen NONE DETECTED NONE DETECTED   Amphetamines, Ur Screen NONE DETECTED NONE DETECTED   MDMA (Ecstasy)Ur Screen NONE DETECTED NONE DETECTED   Cocaine Metabolite,Ur [place] NONE DETECTED NONE DETECTED   Opiate, Ur Screen NONE DETECTED NONE DETECTED   Phencyclidine (PCP) Ur S NONE DETECTED NONE DETECTED   Cannabinoid 50 Ng, Ur [place] NONE DETECTED NONE DETECTED   Barbiturates, Ur Screen NONE DETECTED NONE DETECTED   Benzodiazepine, Ur Scrn POSITIVE (A) NONE DETECTED   Methadone Scn, Ur NONE DETECTED NONE DETECTED  Ethanol      Status: None   Collection Time: 04/21/16  8:57 PM  Result Value Ref Range   Alcohol, Ethyl (B) <5 <5 mg/dL  Acetaminophen level     Status: Abnormal   Collection Time: 04/21/16  8:57 PM  Result Value Ref Range   Acetaminophen (Tylenol), Serum <10 (L) 10 - 30 ug/mL  Salicylate level     Status: None   Collection Time: 04/21/16  8:57 PM  Result Value Ref Range   Salicylate Lvl <4.0 2.8 - 30.0 mg/dL  Pregnancy, urine POC     Status: None   Collection Time: 04/21/16  9:51 PM  Result Value Ref Range   Preg Test, Ur NEGATIVE NEGATIVE   ____________________________________________  ________________________________  RADIOLOGY   ____________________________________________   PROCEDURES  Procedure(s) performed:  none    Critical Care performed: no ____________________________________________   INITIAL IMPRESSION / ASSESSMENT AND PLAN / ED COURSE  Pertinent labs & imaging results that were available during my care of the patient were reviewed by me and considered in my medical decision making (see chart for details).  DDX: Psychosis, delirium, medication effect, noncompliance, polysubstance abuse, Si, Hi, depression   Teresa Wise is a 40 y.o. who presents to the ED with for evaluation of SI.  Patient has psych history of depression and reportedly PTSD.  Laboratory testing was ordered to evaluation for underlying electrolyte derangement or signs of underlying organic pathology to explain today's presentation.  Based on history and physical and laboratory evaluation, it appears that the patient's presentation is 2/2 underlying psychiatric disorder and will require further evaluation and management by inpatient psychiatry.  Patient was  made an IVC due to SI with a plan.  Disposition pending psychiatric evaluation.   Clinical Course     ____________________________________________   FINAL CLINICAL IMPRESSION(S) / ED DIAGNOSES  Final diagnoses:  Depression  Suicidal  ideation      NEW MEDICATIONS STARTED DURING THIS VISIT:  New Prescriptions   No medications on file     Note:  This document was prepared using Dragon voice recognition software and may include unintentional dictation errors.    Willy Eddy, MD 04/21/16 2209

## 2016-04-21 NOTE — ED Triage Notes (Signed)
Pt ambulatory to triage with steady gait, with ac/o SI, reports would shot herself with husbands gun. Pt reports has been having these thoughts "for a long time." Pt reports sees psychiatrist about every 6 months. Pt reports mother died in March and is having marital issues. Pt denies HI. Pt calm and cooperative.

## 2016-04-22 DIAGNOSIS — F332 Major depressive disorder, recurrent severe without psychotic features: Secondary | ICD-10-CM

## 2016-04-22 DIAGNOSIS — R45851 Suicidal ideations: Secondary | ICD-10-CM

## 2016-04-22 MED ORDER — ALPRAZOLAM 0.5 MG PO TABS
1.0000 mg | ORAL_TABLET | Freq: Three times a day (TID) | ORAL | Status: DC
  Administered 2016-04-22 (×2): 1 mg via ORAL
  Filled 2016-04-22 (×2): qty 2

## 2016-04-22 MED ORDER — IBUPROFEN 800 MG PO TABS
800.0000 mg | ORAL_TABLET | Freq: Once | ORAL | Status: AC
  Administered 2016-04-22: 800 mg via ORAL
  Filled 2016-04-22: qty 1

## 2016-04-22 NOTE — ED Notes (Signed)
Pt left on own had own car was not impaired to drive

## 2016-04-22 NOTE — ED Notes (Signed)
Pt came out to recliner area and stated tearfully I need my xanax ,she takes 1 mg 4x daily, she says I am very upset and says she wants to leave that it is her 38 yr old sons birthday. I explained to her ivc and bhu process.ED MD called orders received, pulse 100

## 2016-04-22 NOTE — Discharge Instructions (Signed)
Follow up with [facility].  Please return as needed.

## 2016-04-22 NOTE — Consult Note (Signed)
[place] [facility] Consult   Reason for Consult:  Consult for 40 year old woman with a history of depression who came to the emergency room last night and was placed on involuntary commitment Referring Physician:  Edd Fabian Patient Identification: Teresa Wise MRN:  [id number] Principal Diagnosis: Severe recurrent major depression without psychotic features [facility]) Diagnosis:   Patient Active Problem List   Diagnosis Date Noted  . Severe recurrent major depression without psychotic features ([place]) [F33.2] 04/22/2016  . Suicidal ideation [R45.851] 04/22/2016    Total Time spent with patient: 1 hour  Subjective:   Teresa Wise is a 40 y.o. female patient admitted with "I just had a rough year".  HPI:  Patient interviewed. Chart reviewed. Labs and vitals reviewed. Patient came voluntarily to the emergency room last night stating she wanted to talk about her depression. Her mood has been more down and depressed recently. More frequent crying spells. Energy level low. Sleep is adequate. Appetite is adequate. Denies that she's having any hallucinations or psychotic symptoms. She says she is compliant with all of her appropriate psychiatric medicine and denies that she's been abusing alcohol or drugs. She reports that yesterday she and her husband got into a verbal argument which got her very upset. She came to the emergency room feeling like she needed to talk. When she was seen by TTS she admitted that she had had suicidal thoughts and reportedly said that she had thought of shooting herself. On evaluation today the patient says that she meant that she had occasionally had passive thoughts but did not have any actual plan or intent to harm herself. Denies any suicidal thoughts now and states that she has too many positive things to live for. Denies that she's been abusing drugs or alcohol. Patient feels like she is stable now and agrees to outpatient treatment.  Social history: Patient lives with  her husband. 3 children at home still. 2 older children have ordered he left the home. Patient stays at home and takes care of the kids. Somewhat explosive relationship with her husband.  Medical history: Denies any acute or chronic medical problems apart from her psychiatric condition.  Substance abuse history: Denies any alcohol abuse. Denies any drug use. Denies abusing any of her prescription medicine.  Past Psychiatric History: Positive history of depression and anxiety of long-standing. Goes to [facility] and sees Dr. Randel Books. Also enrolled in group therapy. Medication is Prozac 40 mg a day and alprazolam 1 mg 4 times a day. She does have a past history of suicide attempts most recently last January by overdose. She does not have a history of inpatient psychiatric treatment. No history of mania.  Risk to Self: Suicidal Ideation: Yes-Currently Present Suicidal Intent: Yes-Currently Present Is patient at risk for suicide?: Yes Suicidal Plan?: Yes-Currently Present Specify Current Suicidal Plan: use a pistol Access to Means: No (Currently in the hospital) What has been your use of drugs/alcohol within the last 12 months?: denied use How many times?: 1 Other Self Harm Risks: denied Triggers for Past Attempts:  (Social anxiety) Intentional Self Injurious Behavior: None Risk to Others: Homicidal Ideation: No Thoughts of Harm to Others: No Current Homicidal Intent: No Current Homicidal Plan: No Access to Homicidal Means: No Identified Victim: None identified History of harm to others?: No Assessment of Violence: None Noted Violent Behavior Description: denied Does patient have access to weapons?: No Criminal Charges Pending?: No Does patient have a court date: No Prior Inpatient Therapy: Prior Inpatient Therapy: No Prior Therapy Dates: n/a  Prior Therapy Facilty/Provider(s): n/a Reason for Treatment: n/a Prior Outpatient Therapy: Prior Outpatient Therapy: Yes Prior Therapy Dates:  Current Prior Therapy Facilty/Provider(s): [facility] Reason for Treatment: PTSD, Depression, Anxiety Does patient have an ACCT team?: No Does patient have Intensive In-House Services?  : No Does patient have [facility] services? : No Does patient have P4CC services?: No  Past Medical History:  Past Medical History:  Diagnosis Date  . Anxiety   . Depressed   . History of shingles   . Ovarian cyst     Past Surgical History:  Procedure Laterality Date  . APPENDECTOMY    . TUBAL LIGATION Bilateral   . TUBAL LIGATION     Family History: No family history on file. Family Psychiatric  History: Mother had substance abuse problems and died from an overdose in the spring of this year which is a major stress for her current mood. Social History:  History  Alcohol Use No     History  Drug Use No    Social History   Social History  . Marital status: Married    Spouse name: N/A  . Number of children: N/A  . Years of education: N/A   Social History Main Topics  . Smoking status: Current Every Day Smoker    Packs/day: 0.50    Types: Cigarettes  . Smokeless tobacco: Never Used  . Alcohol use No  . Drug use: No  . Sexual activity: Yes   Other Topics Concern  . None   Social History Narrative  . None   Additional Social History:    Allergies:  No Known Allergies  Labs:  Results for orders placed or performed during the hospital encounter of 04/21/16 (from the past 48 hour(s))  CBC     Status: Abnormal   Collection Time: 04/21/16  8:57 PM  Result Value Ref Range   WBC 11.5 (H) 3.6 - 11.0 K/uL   RBC 5.11 3.80 - 5.20 MIL/uL   Hemoglobin 15.2 12.0 - 16.0 g/dL   HCT 45.3 35.0 - 47.0 %   MCV 88.6 80.0 - 100.0 fL   MCH 29.8 26.0 - 34.0 pg   MCHC 33.6 32.0 - 36.0 g/dL   RDW 13.4 11.5 - 14.5 %   Platelets 376 150 - 440 K/uL  Comprehensive metabolic panel     Status: Abnormal   Collection Time: 04/21/16  8:57 PM  Result Value Ref Range   Sodium 144 135 - 145 mmol/L   Potassium  3.6 3.5 - 5.1 mmol/L   Chloride 112 (H) 101 - 111 mmol/L   CO2 25 22 - 32 mmol/L   Glucose, Bld 72 65 - 99 mg/dL   BUN 10 6 - 20 mg/dL   Creatinine, Ser 0.89 0.44 - 1.00 mg/dL   Calcium 9.1 8.9 - 10.3 mg/dL   Total Protein 7.1 6.5 - 8.1 g/dL   Albumin 4.0 3.5 - 5.0 g/dL   AST 17 15 - 41 U/L   ALT 18 14 - 54 U/L   Alkaline Phosphatase 58 38 - 126 U/L   Total Bilirubin 0.2 (L) 0.3 - 1.2 mg/dL   GFR calc non Af Amer >60 >60 mL/min   GFR calc Af Amer >60 >60 mL/min    Comment: (NOTE) The eGFR has been calculated using the CKD EPI equation. This calculation has not been validated in all clinical situations. eGFR's persistently <60 mL/min signify possible Chronic Kidney Disease.    Anion gap 7 5 - 15  Urine Drug Screen,  Qualitative ([place] only)     Status: Abnormal   Collection Time: 04/21/16  8:57 PM  Result Value Ref Range   Tricyclic, Ur Screen NONE DETECTED NONE DETECTED   Amphetamines, Ur Screen NONE DETECTED NONE DETECTED   MDMA (Ecstasy)Ur Screen NONE DETECTED NONE DETECTED   Cocaine Metabolite,Ur [place] NONE DETECTED NONE DETECTED   Opiate, Ur Screen NONE DETECTED NONE DETECTED   Phencyclidine (PCP) Ur S NONE DETECTED NONE DETECTED   Cannabinoid 50 Ng, Ur [place] NONE DETECTED NONE DETECTED   Barbiturates, Ur Screen NONE DETECTED NONE DETECTED   Benzodiazepine, Ur Scrn POSITIVE (A) NONE DETECTED   Methadone Scn, Ur NONE DETECTED NONE DETECTED    Comment: (NOTE) 213  Tricyclics, urine               Cutoff 1000 ng/mL 200  Amphetamines, urine             Cutoff 1000 ng/mL 300  MDMA (Ecstasy), urine           Cutoff 500 ng/mL 400  Cocaine Metabolite, urine       Cutoff 300 ng/mL 500  Opiate, urine                   Cutoff 300 ng/mL 600  Phencyclidine (PCP), urine      Cutoff 25 ng/mL 700  Cannabinoid, urine              Cutoff 50 ng/mL 800  Barbiturates, urine             Cutoff 200 ng/mL 900  Benzodiazepine, urine           Cutoff 200 ng/mL 1000 Methadone, urine                 Cutoff 300 ng/mL 1100 1200 The urine drug screen provides only a preliminary, unconfirmed 1300 analytical test result and should not be used for non-medical 1400 purposes. Clinical consideration and professional judgment should 1500 be applied to any positive drug screen result due to possible 1600 interfering substances. A more specific alternate chemical method 1700 must be used in order to obtain a confirmed analytical result.  1800 Gas chromato graphy / mass spectrometry (GC/MS) is the preferred 1900 confirmatory method.   Ethanol     Status: None   Collection Time: 04/21/16  8:57 PM  Result Value Ref Range   Alcohol, Ethyl (B) <5 <5 mg/dL    Comment:        LOWEST DETECTABLE LIMIT FOR SERUM ALCOHOL IS 5 mg/dL FOR MEDICAL PURPOSES ONLY   Acetaminophen level     Status: Abnormal   Collection Time: 04/21/16  8:57 PM  Result Value Ref Range   Acetaminophen (Tylenol), Serum <10 (L) 10 - 30 ug/mL    Comment:        THERAPEUTIC CONCENTRATIONS VARY SIGNIFICANTLY. A RANGE OF 10-30 ug/mL MAY BE AN EFFECTIVE CONCENTRATION FOR MANY PATIENTS. HOWEVER, SOME ARE BEST TREATED AT CONCENTRATIONS OUTSIDE THIS RANGE. ACETAMINOPHEN CONCENTRATIONS >150 ug/mL AT 4 HOURS AFTER INGESTION AND >50 ug/mL AT 12 HOURS AFTER INGESTION ARE OFTEN ASSOCIATED WITH TOXIC REACTIONS.   Salicylate level     Status: None   Collection Time: 04/21/16  8:57 PM  Result Value Ref Range   Salicylate Lvl <0.8 2.8 - 30.0 mg/dL  Pregnancy, urine POC     Status: None   Collection Time: 04/21/16  9:51 PM  Result Value Ref Range   Preg Test, Ur NEGATIVE NEGATIVE  Comment:        THE SENSITIVITY OF THIS METHODOLOGY IS >24 mIU/mL     Current Facility-Administered Medications  Medication Dose Route Frequency Provider Last Rate Last Dose  . ALPRAZolam Duanne Moron) tablet 1 mg  1 mg Oral TID Joanne Gavel, MD   1 mg at 04/22/16 1351   Current Outpatient Prescriptions  Medication Sig Dispense Refill  .  ALPRAZolam (XANAX) 1 MG tablet Take 1 mg by mouth 4 (four) times daily.      Musculoskeletal: Strength & Muscle Tone: within normal limits Gait & Station: normal Patient leans: N/A  Psychiatric Specialty Exam: Physical Exam  Nursing note and vitals reviewed. Constitutional: She appears well-developed and well-nourished.  HENT:  Head: Normocephalic and atraumatic.  Eyes: Conjunctivae are normal. Pupils are equal, round, and reactive to light.  Neck: Normal range of motion.  Cardiovascular: Regular rhythm and normal heart sounds.   Respiratory: Effort normal. No respiratory distress.  GI: Soft.  Musculoskeletal: Normal range of motion.  Neurological: She is alert.  Skin: Skin is warm and dry.  Psychiatric: Her speech is normal and behavior is normal. Judgment normal. Cognition and memory are normal. She exhibits a depressed mood. She expresses no suicidal ideation.    Review of Systems  Constitutional: Negative.   HENT: Negative.   Eyes: Negative.   Respiratory: Negative.   Cardiovascular: Negative.   Gastrointestinal: Negative.   Musculoskeletal: Negative.   Skin: Negative.   Neurological: Negative.   Psychiatric/Behavioral: Positive for depression. Negative for hallucinations, memory loss, substance abuse and suicidal ideas. The patient is not nervous/anxious and does not have insomnia.     Blood pressure 122/74, pulse 85, temperature 98.5 F (36.9 C), temperature source Oral, resp. rate 16, height 5' (1.524 m), weight 72.6 kg (160 lb), last menstrual period 04/09/2016, SpO2 100 %.Body mass index is 31.25 kg/m.  General Appearance: Casual  Eye Contact:  Fair  Speech:  Normal Rate  Volume:  Decreased  Mood:  Depressed  Affect:  Tearful  Thought Process:  Goal Directed  Orientation:  Full (Time, Place, and Person)  Thought Content:  Logical  Suicidal Thoughts:  No  Homicidal Thoughts:  No  Memory:  Immediate;   Good Recent;   Fair Remote;   Fair  Judgement:  Fair   Insight:  Fair  Psychomotor Activity:  Normal  Concentration:  Concentration: Fair  Recall:  [employer] of Knowledge:  Fair  Language:  Fair  Akathisia:  No  Handed:  Right  AIMS (if indicated):     Assets:  Communication Skills Desire for Improvement Financial Resources/Insurance Housing Physical Health Resilience Social Support  ADL's:  Intact  Cognition:  WNL  Sleep:        Treatment Plan Summary: Plan 40 year old woman with a history of major depression but with no psychotic symptoms. She has consistently said since she first came in that she did not actually have a plan to kill her self. She is asking to be discharged from the emergency room. Denies any current suicidal ideation. Patient appears to be lucid and agreeable to appropriate outpatient psychiatric follow-up. Patient has been counseled about the importance of compliance with medicine and with outpatient therapy. Strongly encouraged to continue her participation in therapy groups. She will be taken off of IVC and can be discharged home with follow-up at [facility]. Case reviewed with emergency room physician and TTS.  Disposition: Patient does not meet criteria for psychiatric inpatient admission. Supportive therapy provided about ongoing stressors.  Alethia Berthold, MD 04/22/2016 4:15 PM

## 2016-04-22 NOTE — ED Provider Notes (Signed)
-----------------------------------------   7:31 AM on 04/22/2016 -----------------------------------------   Blood pressure 117/82, pulse 91, temperature 98.1 F (36.7 C), temperature source Oral, resp. rate 16, height 5' (1.524 m), weight 160 lb (72.6 kg), last menstrual period 04/09/2016, SpO2 98 %.  The patient had no acute events since last update.  Calm and cooperative at this time.  Disposition is pending Psychiatry/Behavioral Medicine team recommendations.     Irean HongJade J Sung, MD 04/22/16 [phone]

## 2016-04-23 ENCOUNTER — Other Ambulatory Visit: Payer: Self-pay

## 2016-06-03 ENCOUNTER — Emergency Department
Admission: EM | Admit: 2016-06-03 | Discharge: 2016-06-03 | Disposition: A | Discharge status: Home / Self Care | Payer: Self-pay | Attending: Student | Admitting: Student

## 2016-06-03 ENCOUNTER — Encounter: Payer: Self-pay | Admitting: Emergency Medicine

## 2016-06-03 DIAGNOSIS — M5432 Sciatica, left side: Secondary | ICD-10-CM

## 2016-06-03 DIAGNOSIS — M5442 Lumbago with sciatica, left side: Secondary | ICD-10-CM | POA: Insufficient documentation

## 2016-06-03 DIAGNOSIS — F17210 Nicotine dependence, cigarettes, uncomplicated: Secondary | ICD-10-CM | POA: Insufficient documentation

## 2016-06-03 MED ORDER — PREDNISONE 10 MG PO TABS: 10.0000 mg | ORAL_TABLET | Freq: Two times a day (BID) | ORAL | 0 refills | Status: DC

## 2016-06-03 MED ORDER — KETOROLAC TROMETHAMINE 60 MG/2ML IM SOLN
30.0000 mg | Freq: Once | INTRAMUSCULAR | Status: AC
  Administered 2016-06-03: 30 mg via INTRAMUSCULAR
  Filled 2016-06-03: qty 2

## 2016-06-03 MED ORDER — ORPHENADRINE CITRATE 30 MG/ML IJ SOLN
60.0000 mg | INTRAMUSCULAR | Status: AC
  Administered 2016-06-03: 60 mg via INTRAMUSCULAR
  Filled 2016-06-03: qty 2

## 2016-06-03 MED ORDER — ORPHENADRINE CITRATE ER 100 MG PO TB12: 100.0000 mg | ORAL_TABLET | Freq: Two times a day (BID) | ORAL | 0 refills | Status: DC | PRN

## 2016-06-03 NOTE — ED Triage Notes (Signed)
Pt with low back pain and radiates down into left leg. Pt with hx of sciatica.

## 2016-06-03 NOTE — ED Provider Notes (Signed)
[facility] Emergency Department Provider Note ____________________________________________  Time seen: [id number]  I have reviewed the triage vital signs and the nursing notes.  HISTORY  Chief Complaint  Sciatica   HPI Teresa Wise is a 40 y.o. female who presents with low back pain that radiates down to her left buttock and leg x2 days. Patient has a history of sciatica and says this feels the same. Describes pain as 10/10, burning, worse with movement or weight bearing. Patient denies fever, bowel or bladder incontinence, weakness or footdrop. Patient has been treated with percocet in the past. She has taken 800 mg ibuprofen today with no relief.   Past Medical History:  Diagnosis Date  . Anxiety   . Depressed   . History of shingles   . Ovarian cyst     Patient Active Problem List   Diagnosis Date Noted  . 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 Suicidal ideation 04/22/2016    Past Surgical History:  Procedure Laterality Date  . APPENDECTOMY    . TUBAL LIGATION Bilateral   . TUBAL LIGATION      Prior to Admission medications   Medication Sig Start Date End Date Taking? Authorizing Provider  ALPRAZolam Prudy Feeler(XANAX) 1 MG tablet Take 1 mg by mouth 4 (four) times daily.    Historical Provider, MD  orphenadrine (NORFLEX) 100 MG tablet Take 1 tablet (100 mg total) by mouth 2 (two) times daily as needed for muscle spasms. 06/03/16   Charae Depaolis V Bacon Calliope Delangel, PA-C  predniSONE (DELTASONE) 10 MG tablet Take 1 tablet (10 mg total) by mouth 2 (two) times daily with a meal. 06/03/16   Nuh Lipton V Bacon Ashtian Villacis, PA-C    Allergies Review of patient's allergies indicates no known allergies.  No family history on file.  Social History Social History  Substance Use Topics  . Smoking status: Current Every Day Smoker    Packs/day: 0.50    Types: Cigarettes  . Smokeless tobacco: Never Used  . Alcohol use No    Review of  Systems  Constitutional: Negative for fever. Cardiovascular: Negative for chest pain. Respiratory: Negative for shortness of breath. Musculoskeletal:Positive for back pain radiating down left leg. Neurological: Negative for headaches, focal weakness or numbness. Negative for incontinence.  ____________________________________________  PHYSICAL EXAM:  VITAL SIGNS: ED Triage Vitals  Enc Vitals Group     BP 06/03/16 1824 95/66     Pulse Rate 06/03/16 1824 88     Resp 06/03/16 1824 17     Temp 06/03/16 1824 98.5 F (36.9 C)     Temp Source 06/03/16 1824 Oral     SpO2 06/03/16 1824 99 %     Weight 06/03/16 1825 159 lb (72.1 kg)     Height 06/03/16 1825 5' (1.524 m)     Head Circumference --      Peak Flow --      Pain Score 06/03/16 1832 10     Pain Loc --      Pain Edu? --      Excl. in GC? --     Constitutional: Alert and oriented. Well appearing and in no distress. Head: Normocephalic and atraumatic. Eyes: Conjunctivae are normal.  Neck: Supple. No stridor. Full ROM without pain or difficulty.  Cardiovascular: Normal rate, regular rhythm. Normal distal pulses. Respiratory: Normal respiratory effort. No wheezes/rales/rhonchi. Musculoskeletal: No bony tenderness of the spine. Pain in LLE with SLR. ROM in LLE limited by pain. Strength 5/5. Neurologic: Normal speech  and language. No gross focal neurologic deficits are appreciated. Reflexes intact in bilateral lower extremities.  Skin:  Skin is warm, dry and intact. No rash noted. Psychiatric: Mood and affect are normal. Patient exhibits appropriate insight and judgment. ____________________________________________  PROCEDURES  IM toradol 30 mg IM norflex 60 mg ____________________________________________  INITIAL IMPRESSION / ASSESSMENT AND PLAN / ED COURSE  Patient presentation consistent with sciatica of the left side. Patient given prescriptions for prednisone and norflex. Patient to follow up with orthopedic surgery  as needed. No other emergency medicine complaints at this time.   Clinical Course   ____________________________________________  FINAL CLINICAL IMPRESSION(S) / ED DIAGNOSES  Final diagnoses:  Sciatica of left side     Lissa Hoard, PA-C 06/03/16 2359    Gayla Doss, MD 06/04/16 0020

## 2016-06-03 NOTE — Discharge Instructions (Signed)
You may resume ibuprofen for pain and inflammation after finishing the prednisone.   Return to the ED if you have symptoms such as high fever (> 100.4), bowel or bladder incontinence, weakness, or foot drag.

## 2016-08-23 ENCOUNTER — Emergency Department
Admission: EM | Admit: 2016-08-23 | Discharge: 2016-08-23 | Disposition: A | Discharge status: Home / Self Care | Payer: Self-pay | Attending: Student in an Organized Health Care Education/Training Program | Admitting: Student in an Organized Health Care Education/Training Program

## 2016-08-23 ENCOUNTER — Emergency Department: Payer: Self-pay

## 2016-08-23 DIAGNOSIS — F17210 Nicotine dependence, cigarettes, uncomplicated: Secondary | ICD-10-CM | POA: Insufficient documentation

## 2016-08-23 DIAGNOSIS — R079 Chest pain, unspecified: Secondary | ICD-10-CM | POA: Insufficient documentation

## 2016-08-23 DIAGNOSIS — R5383 Other fatigue: Secondary | ICD-10-CM | POA: Insufficient documentation

## 2016-08-23 DIAGNOSIS — Z5321 Procedure and treatment not carried out due to patient leaving prior to being seen by health care provider: Secondary | ICD-10-CM | POA: Insufficient documentation

## 2016-08-23 DIAGNOSIS — M549 Dorsalgia, unspecified: Secondary | ICD-10-CM | POA: Insufficient documentation

## 2016-08-23 DIAGNOSIS — Z79899 Other long term (current) drug therapy: Secondary | ICD-10-CM | POA: Insufficient documentation

## 2016-08-23 LAB — CBC
HCT: 40.1 % (ref 35.0–47.0)
Hemoglobin: 13.5 g/dL (ref 12.0–16.0)
MCH: 29.6 pg (ref 26.0–34.0)
MCHC: 33.6 g/dL (ref 32.0–36.0)
MCV: 88 fL (ref 80.0–100.0)
PLATELETS: 317 10*3/uL (ref 150–440)
RBC: 4.55 MIL/uL (ref 3.80–5.20)
RDW: 14 % (ref 11.5–14.5)
WBC: 11.3 10*3/uL — AB (ref 3.6–11.0)

## 2016-08-23 LAB — BASIC METABOLIC PANEL
Anion gap: 7 (ref 5–15)
BUN: 11 mg/dL (ref 6–20)
CALCIUM: 8.5 mg/dL — AB (ref 8.9–10.3)
CO2: 25 mmol/L (ref 22–32)
CREATININE: 0.63 mg/dL (ref 0.44–1.00)
Chloride: 108 mmol/L (ref 101–111)
Glucose, Bld: 107 mg/dL — ABNORMAL HIGH (ref 65–99)
Potassium: 3.6 mmol/L (ref 3.5–5.1)
SODIUM: 140 mmol/L (ref 135–145)

## 2016-08-23 LAB — TROPONIN I

## 2016-08-23 NOTE — ED Triage Notes (Signed)
Pt came to ED via pov c/o chest pain and sob for the past 2 days. Reports constant central chest pain 7/10 w/ some upper back pain. Reports dry cough and feeling more fatigued than normal. Denies fever, body aches.

## 2016-08-28 ENCOUNTER — Emergency Department
Admission: EM | Admit: 2016-08-28 | Discharge: 2016-08-28 | Disposition: A | Discharge status: Home / Self Care | Payer: Self-pay | Attending: Emergency Medicine | Admitting: Emergency Medicine

## 2016-08-28 ENCOUNTER — Emergency Department: Payer: Self-pay

## 2016-08-28 ENCOUNTER — Encounter: Payer: Self-pay | Admitting: Emergency Medicine

## 2016-08-28 DIAGNOSIS — F17210 Nicotine dependence, cigarettes, uncomplicated: Secondary | ICD-10-CM | POA: Insufficient documentation

## 2016-08-28 DIAGNOSIS — J069 Acute upper respiratory infection, unspecified: Secondary | ICD-10-CM | POA: Insufficient documentation

## 2016-08-28 LAB — POCT RAPID STREP A: Streptococcus, Group A Screen (Direct): NEGATIVE

## 2016-08-28 MED ORDER — ALBUTEROL SULFATE HFA 108 (90 BASE) MCG/ACT IN AERS: 2.0000 | INHALATION_SPRAY | Freq: Four times a day (QID) | RESPIRATORY_TRACT | 2 refills | Status: DC | PRN

## 2016-08-28 MED ORDER — IPRATROPIUM-ALBUTEROL 0.5-2.5 (3) MG/3ML IN SOLN
3.0000 mL | Freq: Once | RESPIRATORY_TRACT | Status: AC
Start: 2016-08-28 — End: 2016-08-28
  Administered 2016-08-28: 3 mL via RESPIRATORY_TRACT
  Filled 2016-08-28: qty 3

## 2016-08-28 MED ORDER — PREDNISONE 10 MG PO TABS: ORAL_TABLET | ORAL | 0 refills | Status: DC

## 2016-08-28 NOTE — ED Provider Notes (Signed)
[facility] Emergency Department Provider Note   ____________________________________________   First MD Initiated Contact with Patient 08/28/16 1053     (approximate)  I have reviewed the triage vital signs and the nursing notes.   HISTORY  Chief Complaint Nasal Congestion and Cough    HPI Teresa Wise is a 41 y.o. female as to the emergency room with congestion and nonproductive cough. Patient states that she has had symptoms for approximately one week. She denies any fever or sore throat. She states that she is told that while she is sleeping it sounds like she is wheezing. She states that she feels as if she is not taking deep breaths but denies any shortness of breath.  Patient states that over-the-counter medication has not helped her cough. Patient denies any previous history of asthma. Patient is a smoker of  26 years 1 pack per day. She denies any pain at this time.   Past Medical History:  Diagnosis Date  . Anxiety   . Depressed   . History of shingles   . Ovarian cyst     Patient Active Problem List   Diagnosis Date Noted  . 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 Suicidal ideation 04/22/2016    Past Surgical History:  Procedure Laterality Date  . APPENDECTOMY    . TUBAL LIGATION Bilateral   . TUBAL LIGATION      Prior to Admission medications   Medication Sig Start Date End Date Taking? Authorizing Provider  albuterol (PROVENTIL HFA;VENTOLIN HFA) 108 (90 Base) MCG/ACT inhaler Inhale 2 puffs into the lungs every 6 (six) hours as needed for wheezing or shortness of breath. 08/28/16   Tommi Rumps, PA-C  ALPRAZolam Prudy Feeler) 1 MG tablet Take 1 mg by mouth 4 (four) times daily.    Historical Provider, MD  predniSONE (DELTASONE) 10 MG tablet Take 3 tabs once a day for 3 days 08/28/16   Tommi Rumps, PA-C    Allergies Patient has no known allergies.  No family history on file.  Social  History Social History  Substance Use Topics  . Smoking status: Current Every Day Smoker    Packs/day: 1.00    Types: Cigarettes  . Smokeless tobacco: Never Used  . Alcohol use No    Review of Systems Constitutional: No fever/chills Eyes: No visual changes. ENT: No sore throat. Positive nasal congestion. Cardiovascular: Denies chest pain. Respiratory: Denies shortness of breath. Positive nonproductive cough. Gastrointestinal: No abdominal pain.  No nausea, no vomiting.  No diarrhea.  Musculoskeletal: Negative for complaints. Skin: Negative for rash. Neurological: Negative for headaches, focal weakness or numbness.  10-point ROS otherwise negative.  ____________________________________________   PHYSICAL EXAM:  VITAL SIGNS: ED Triage Vitals [08/28/16 1015]  Enc Vitals Group     BP 103/79     Pulse Rate 86     Resp 18     Temp 97.6 F (36.4 C)     Temp Source Oral     SpO2 96 %     Weight 166 lb (75.3 kg)     Height 5' (1.524 m)     Head Circumference      Peak Flow      Pain Score 0     Pain Loc      Pain Edu?      Excl. in GC?     Constitutional: Alert and oriented. Well appearing and in no acute distress. Eyes: Conjunctivae are normal. PERRL. EOMI. Head:  Atraumatic. Nose: Moderate congestion/no rhinnorhea.   EACs and TMs are clear bilaterally. Mouth/Throat: Mucous membranes are moist.  Oropharynx non-erythematous. Moderate posterior drainage Neck: No stridor.   Hematological/Lymphatic/Immunilogical: No cervical lymphadenopathy. Cardiovascular: Normal rate, regular rhythm. Grossly normal heart sounds.  Good peripheral circulation.  Respiratory: Normal respiratory effort.  No retractions. Lungs CTAB. Musculoskeletal: Moves upper and lower extremities without any difficulty. Normal gait was noted. Neurologic:  Normal speech and language. No gross focal neurologic deficits are appreciated. No gait instability. Skin:  Skin is warm, dry and intact. No rash  noted. Psychiatric: Mood and affect are normal. Speech and behavior are normal.  ____________________________________________   LABS (all labs ordered are listed, but only abnormal results are displayed)  Labs Reviewed  CULTURE, GROUP A STREP [facility])  POCT RAPID STREP A    RADIOLOGY Chest x-ray per radiologist: IMPRESSION:  Mild hyperinflation consistent with the patient's smoking history or  early COPD. There is no pneumonia nor other acute cardiopulmonary  abnormality.   I, Tommi Rumpshonda L Summers, personally viewed and evaluated these images (plain radiographs) as part of my medical decision making, as well as reviewing the written report by the radiologist.   ____________________________________________   PROCEDURES  Procedure(s) performed: None  Procedures  Critical Care performed: No  ____________________________________________   INITIAL IMPRESSION / ASSESSMENT AND PLAN / ED COURSE  Pertinent labs & imaging results that were available during my care of the patient were reviewed by me and considered in my medical decision making (see chart for details).    Clinical Course    Discussed chest x-ray findings with patient.  Patient states that she plans to discontinue smoking as per for New Year's resolution. Patient was started on a prescription for prednisone 20 mg once a day for 3 days, albuterol inhaler 2 puffs 4 times a day.  Patient will follow up with primary care doctor if any continued problems. She is also encouraged to work on the hospital website for smoking cessation classes.  ____________________________________________   FINAL CLINICAL IMPRESSION(S) / ED DIAGNOSES  Final diagnoses:  Acute upper respiratory infection  Cigarette smoker      NEW MEDICATIONS STARTED DURING THIS VISIT:  New Prescriptions   ALBUTEROL (PROVENTIL HFA;VENTOLIN HFA) 108 (90 BASE) MCG/ACT INHALER    Inhale 2 puffs into the lungs every 6 (six) hours as needed for wheezing  or shortness of breath.   PREDNISONE (DELTASONE) 10 MG TABLET    Take 3 tabs once a day for 3 days     Note:  This document was prepared using Dragon voice recognition software and may include unintentional dictation errors.    Tommi RumpsRhonda L Summers, PA-C 08/28/16 1316    Myrna Blazeravid Matthew Schaevitz, MD 08/28/16 [phone]

## 2016-08-28 NOTE — ED Notes (Signed)
See triage note.  Congestion and dry cough for several days  Afebrile on arrival

## 2016-08-28 NOTE — Discharge Instructions (Signed)
Follow-up with your primary care doctor if any continued problems. Prednisone 10 mg 3 tablets once a day for the next 3 days. Albuterol inhaler 2 puffs 4 times a day. Discontinue smoking. There are smoking cessation classes available. The hospital also has classes available and information can be obtained on the website.

## 2016-08-28 NOTE — ED Triage Notes (Addendum)
Patient presents to the ED with congestion and dry cough.  Patient ambulatory to triage with no obvious distress.  Patient denies fever or sore throat.  Patient states symptoms started about 1 week ago.

## 2017-01-11 ENCOUNTER — Encounter: Payer: Self-pay | Admitting: Emergency Medicine

## 2017-01-11 ENCOUNTER — Emergency Department
Admission: EM | Admit: 2017-01-11 | Discharge: 2017-01-11 | Disposition: A | Discharge status: Home / Self Care | Payer: Self-pay | Attending: Emergency Medicine | Admitting: Emergency Medicine

## 2017-01-11 DIAGNOSIS — F332 Major depressive disorder, recurrent severe without psychotic features: Secondary | ICD-10-CM | POA: Insufficient documentation

## 2017-01-11 DIAGNOSIS — F419 Anxiety disorder, unspecified: Secondary | ICD-10-CM | POA: Insufficient documentation

## 2017-01-11 DIAGNOSIS — F17210 Nicotine dependence, cigarettes, uncomplicated: Secondary | ICD-10-CM | POA: Insufficient documentation

## 2017-01-11 DIAGNOSIS — Z760 Encounter for issue of repeat prescription: Secondary | ICD-10-CM | POA: Insufficient documentation

## 2017-01-11 MED ORDER — ALPRAZOLAM 1 MG PO TABS: 1.0000 mg | ORAL_TABLET | Freq: Four times a day (QID) | ORAL | 0 refills | Status: AC

## 2017-01-11 MED ORDER — ALPRAZOLAM 0.5 MG PO TABS
1.0000 mg | ORAL_TABLET | Freq: Once | ORAL | Status: AC
  Administered 2017-01-11: 1 mg via ORAL
  Filled 2017-01-11: qty 2

## 2017-01-11 NOTE — ED Triage Notes (Signed)
Needs Xanax RX refilled.  Patient take 1mg  QID, and ran out of med Thursday night.  Has appointment scheduled with PCP on June 6th.

## 2017-01-11 NOTE — Discharge Instructions (Signed)
Please take medications as prescribed and call your primary care doctor tomorrow morning to schedule medication refill. Return to the ER for any worsening symptoms urgent changes in her health.

## 2017-01-11 NOTE — ED Provider Notes (Signed)
[facility]-EMERGENCY DEPARTMENT Provider Note   CSN: [id number] Arrival date & time: 01/11/17  1458     History   Chief Complaint Chief Complaint  Patient presents with  . Medication Refill    HPI Teresa Wise is a 41 y.o. female who presents today for evaluation of medication refill. Patient ran out of her medication for alprazolam 1 mg 4 times a day 3 days ago. [facility]. She has been without her medicine of last 2 days stay she is feeling some mild nausea with diarrhea and feeling very anxious. She denies any chest pain, shortness of breath, abdominal pain. No suicidal, homicidal ideation. No hallucinations.   HPI  Past Medical History:  Diagnosis Date  . Anxiety   . Depressed   . History of shingles   . Ovarian cyst     Patient Active Problem List   Diagnosis Date Noted  . 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 Suicidal ideation 04/22/2016    Past Surgical History:  Procedure Laterality Date  . APPENDECTOMY    . TUBAL LIGATION Bilateral   . TUBAL LIGATION      OB History    No data available       Home Medications    Prior to Admission medications   Medication Sig Start Date End Date Taking? Authorizing Provider  albuterol (PROVENTIL HFA;VENTOLIN HFA) 108 (90 Base) MCG/ACT inhaler Inhale 2 puffs into the lungs every 6 (six) hours as needed for wheezing or shortness of breath. 08/28/16   Tommi RumpsSummers, Rhonda L, PA-C  ALPRAZolam Prudy Feeler(XANAX) 1 MG tablet Take 1 mg by mouth 4 (four) times daily.    [provider]  ALPRAZolam Prudy Feeler(XANAX) 1 MG tablet Take 1 tablet (1 mg total) by mouth 4 (four) times daily. 01/11/17 01/11/18  Evon SlackGaines, Thomas C, PA-C  predniSONE (DELTASONE) 10 MG tablet Take 3 tabs once a day for 3 days 08/28/16   Tommi RumpsSummers, Rhonda L, PA-C    Family History No family history on file.  Social History Social History  Substance Use Topics  . Smoking status: Current  Every Day Smoker    Packs/day: 1.00    Types: Cigarettes  . Smokeless tobacco: Never Used  . Alcohol use No     Allergies   Patient has no known allergies.   Review of Systems Review of Systems  Constitutional: Negative for activity change, chills, fatigue and fever.  Eyes: Negative for visual disturbance.  Respiratory: Negative for chest tightness and shortness of breath.   Cardiovascular: Negative for chest pain.  Gastrointestinal: Positive for nausea. Negative for abdominal pain, diarrhea and vomiting.  Genitourinary: Negative for dysuria.  Musculoskeletal: Negative for arthralgias and gait problem.  Skin: Negative for rash.  Neurological: Positive for tremors. Negative for weakness, numbness and headaches.  Hematological: Negative for adenopathy.  Psychiatric/Behavioral: Negative for agitation, behavioral problems and confusion.     Physical Exam Updated Vital Signs BP 116/81 (BP Location: Left Arm)   Pulse 89   Temp 98.4 F (36.9 C) (Oral)   Resp 16   Ht 5' (1.524 m)   Wt 160 lb (72.6 kg)   LMP 01/10/2017   SpO2 98%   BMI 31.25 kg/m   Physical Exam  Constitutional: She is oriented to person, place, and time. She appears well-developed and well-nourished.  HENT:  Head: Normocephalic and atraumatic.  Eyes: Conjunctivae and EOM are normal.  Neck: Normal range of motion.  Cardiovascular: Normal rate.   Pulmonary/Chest: Effort normal. No respiratory distress.  Neurological: She is alert and oriented to person, place, and time. Coordination normal.  Psychiatric: She has a normal mood and affect. Her behavior is normal. Judgment and thought content normal.     ED Treatments / Results  Labs (all labs ordered are listed, but only abnormal results are displayed) Labs Reviewed - No data to display  EKG  EKG Interpretation None       Radiology No results found.  Procedures Procedures (including critical care time)  Medications Ordered in  ED Medications  ALPRAZolam (XANAX) tablet 1 mg (not administered)     Initial Impression / Assessment and Plan / ED Course  I have reviewed the triage vital signs and the nursing notes.  Pertinent labs & imaging results that were available during my care of the patient were reviewed by me and considered in my medical decision making (see chart for details).     41 year old female with medication refill for Xanax 1 mg 4 times a day. She is given #5 tablets and will call PCP tomorrow to schedule this refill. She is educated on signs and symptoms return to the ED for.  Final Clinical Impressions(s) / ED Diagnoses   Final diagnoses:  Medication refill    New Prescriptions New Prescriptions   ALPRAZOLAM (XANAX) 1 MG TABLET    Take 1 tablet (1 mg total) by mouth 4 (four) times daily.     Evon Slack, PA-C 01/11/17 1602    Arnaldo Natal, MD 01/13/17 1726

## 2017-01-11 NOTE — ED Notes (Addendum)
Pt states she is out of refills of her xanax 1mg  QID that is prescribed by Dr. Carman ChingMoffet. Pt goes to [facility] and gets it filled there. Pt states she is starting to detox, has been without medication since Friday.  + tremors, c/o nausea and diarrhea and tingling in fingertips, c/o anxiousness as well.

## 2017-08-07 ENCOUNTER — Emergency Department
Admission: EM | Admit: 2017-08-07 | Discharge: 2017-08-07 | Disposition: A | Discharge status: Home / Self Care | Payer: Self-pay | Attending: Emergency Medicine | Admitting: Emergency Medicine

## 2017-08-07 ENCOUNTER — Other Ambulatory Visit: Payer: Self-pay

## 2017-08-07 DIAGNOSIS — B029 Zoster without complications: Secondary | ICD-10-CM | POA: Insufficient documentation

## 2017-08-07 DIAGNOSIS — Z79899 Other long term (current) drug therapy: Secondary | ICD-10-CM | POA: Insufficient documentation

## 2017-08-07 DIAGNOSIS — F17210 Nicotine dependence, cigarettes, uncomplicated: Secondary | ICD-10-CM | POA: Insufficient documentation

## 2017-08-07 LAB — URINALYSIS, COMPLETE (UACMP) WITH MICROSCOPIC
Bilirubin Urine: NEGATIVE
Glucose, UA: NEGATIVE mg/dL
Hgb urine dipstick: NEGATIVE
Ketones, ur: NEGATIVE mg/dL
Leukocytes, UA: NEGATIVE
Nitrite: POSITIVE — AB
Protein, ur: NEGATIVE mg/dL
SPECIFIC GRAVITY, URINE: 1.02 (ref 1.005–1.030)
pH: 6 (ref 5.0–8.0)

## 2017-08-07 LAB — CBC WITH DIFFERENTIAL/PLATELET
BASOS PCT: 1 %
Basophils Absolute: 0.1 10*3/uL (ref 0–0.1)
Eosinophils Absolute: 0.2 10*3/uL (ref 0–0.7)
Eosinophils Relative: 2 %
HEMATOCRIT: 44.7 % (ref 35.0–47.0)
Hemoglobin: 14.9 g/dL (ref 12.0–16.0)
LYMPHS ABS: 2 10*3/uL (ref 1.0–3.6)
LYMPHS PCT: 27 %
MCH: 29.8 pg (ref 26.0–34.0)
MCHC: 33.4 g/dL (ref 32.0–36.0)
MCV: 89 fL (ref 80.0–100.0)
MONO ABS: 0.7 10*3/uL (ref 0.2–0.9)
MONOS PCT: 9 %
NEUTROS ABS: 4.7 10*3/uL (ref 1.4–6.5)
Neutrophils Relative %: 61 %
Platelets: 299 10*3/uL (ref 150–440)
RBC: 5.02 MIL/uL (ref 3.80–5.20)
RDW: 13.7 % (ref 11.5–14.5)
WBC: 7.6 10*3/uL (ref 3.6–11.0)

## 2017-08-07 LAB — POCT PREGNANCY, URINE: PREG TEST UR: NEGATIVE

## 2017-08-07 MED ORDER — OXYCODONE HCL 5 MG PO TABS: 5.0000 mg | ORAL_TABLET | ORAL | 0 refills | Status: DC | PRN

## 2017-08-07 MED ORDER — OXYCODONE-ACETAMINOPHEN 5-325 MG PO TABS
1.0000 | ORAL_TABLET | Freq: Once | ORAL | Status: AC
  Administered 2017-08-07: 1 via ORAL
  Filled 2017-08-07: qty 1

## 2017-08-07 MED ORDER — FAMCICLOVIR 500 MG PO TABS: 500.0000 mg | ORAL_TABLET | Freq: Three times a day (TID) | ORAL | 0 refills | Status: DC

## 2017-08-07 MED ORDER — METHYLPREDNISOLONE 4 MG PO TBPK: ORAL_TABLET | ORAL | 0 refills | Status: DC

## 2017-08-07 NOTE — ED Triage Notes (Signed)
Pt arrives to ED with c/o shingles that began yesterday to R side of body. Alert, oriented, ambulatory.

## 2017-08-07 NOTE — ED Provider Notes (Signed)
[facility] Emergency Department Provider Note  ____________________________________________   First MD Initiated Contact with Patient 08/07/17 1450     (approximate)  I have reviewed the triage vital signs and the nursing notes.   HISTORY  Chief Complaint Herpes Zoster    HPI Teresa Wise is a 41 y.o. female he complains of shingles, she has a history of shingles they are usually on her face, this time they are on the right buttock, they started yesterday, the area is very painful, she denies any other problems, no known injury  Past Medical History:  Diagnosis Date  . Anxiety   . Depressed   . History of shingles   . Ovarian cyst     Patient Active Problem List   Diagnosis Date Noted  . 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 Suicidal ideation 04/22/2016    Past Surgical History:  Procedure Laterality Date  . APPENDECTOMY    . TUBAL LIGATION Bilateral   . TUBAL LIGATION      Prior to Admission medications   Medication Sig Start Date End Date Taking? Authorizing Provider  albuterol (PROVENTIL HFA;VENTOLIN HFA) 108 (90 Base) MCG/ACT inhaler Inhale 2 puffs into the lungs every 6 (six) hours as needed for wheezing or shortness of breath. 08/28/16   Tommi RumpsSummers, Rhonda L, PA-C  ALPRAZolam Prudy Feeler(XANAX) 1 MG tablet Take 1 mg by mouth 4 (four) times daily.    [provider]  ALPRAZolam Prudy Feeler(XANAX) 1 MG tablet Take 1 tablet (1 mg total) by mouth 4 (four) times daily. 01/11/17 01/11/18  Evon SlackGaines, Thomas C, PA-C  famciclovir (FAMVIR) 500 MG tablet Take 1 tablet (500 mg total) by mouth 3 (three) times daily. 08/07/17   Seleste Tallman, Roselyn BeringSusan W, PA-C  methylPREDNISolone (MEDROL DOSEPAK) 4 MG TBPK tablet Take 6 pills on day one then decrease by 1 pill each day 08/07/17   Faythe GheeFisher, Dayton Sherr W, PA-C  oxyCODONE (OXY IR/ROXICODONE) 5 MG immediate release tablet Take 1 tablet (5 mg total) by mouth every 4 (four) hours as needed for severe  pain. 08/07/17   Faythe GheeFisher, Elizebeth Kluesner W, PA-C    Allergies Patient has no known allergies.  History reviewed. No pertinent family history.  Social History Social History   Tobacco Use  . Smoking status: Current Every Day Smoker    Packs/day: 1.00    Types: Cigarettes  . Smokeless tobacco: Never Used  Substance Use Topics  . Alcohol use: No  . Drug use: No    Review of Systems  Constitutional: No fever/chills Eyes: No visual changes. ENT: No sore throat. Respiratory: Denies cough Genitourinary: Negative for dysuria. Musculoskeletal: Negative for back pain. Skin: Positive for rash.    ____________________________________________   PHYSICAL EXAM:  VITAL SIGNS: ED Triage Vitals  Enc Vitals Group     BP 08/07/17 1358 116/80     Pulse Rate 08/07/17 1358 99     Resp 08/07/17 1358 18     Temp 08/07/17 1358 99.2 F (37.3 C)     Temp Source 08/07/17 1358 Oral     SpO2 08/07/17 1358 98 %     Weight 08/07/17 1359 142 lb (64.4 kg)     Height 08/07/17 1359 5' (1.524 m)     Head Circumference --      Peak Flow --      Pain Score 08/07/17 1415 10     Pain Loc --      Pain Edu? --      Excl.  in GC? --     Constitutional: Alert and oriented. Well appearing and in no acute distress. Eyes: Conjunctivae are normal.  Head: Atraumatic. Nose: No congestion/rhinnorhea. Mouth/Throat: Mucous membranes are moist.   Cardiovascular: Normal rate, regular rhythm. heart sounds are normal  Respiratory: Normal respiratory effort.  No retractions, lungs are clear to auscultation GU: deferred Musculoskeletal: FROM all extremities, warm and well perfused Neurologic:  Normal speech and language.  Skin:  Skin is warm, dry and intact.  There is a vesicular rash on erythematous base along the right buttock in a dermatome, there is no active drainage, neurovascular is intact. Psychiatric: Mood and affect are normal. Speech and behavior are normal.  ____________________________________________     LABS (all labs ordered are listed, but only abnormal results are displayed)  Labs Reviewed  URINALYSIS, COMPLETE (UACMP) WITH MICROSCOPIC - Abnormal; Notable for the following components:      Result Value   Color, Urine YELLOW (*)    APPearance HAZY (*)    Nitrite POSITIVE (*)    Bacteria, UA RARE (*)    Squamous Epithelial / LPF 6-30 (*)    All other components within normal limits  CBC WITH DIFFERENTIAL/PLATELET  POCT PREGNANCY, URINE   ____________________________________________   ____________________________________________  RADIOLOGY    ____________________________________________   PROCEDURES  Procedure(s) performed: No      ____________________________________________   INITIAL IMPRESSION / ASSESSMENT AND PLAN / ED COURSE  Pertinent labs & imaging results that were available during my care of the patient were reviewed by me and considered in my medical decision making (see chart for details).  Patient is a 41 year old female with a history of shingles, she states she is having a new outbreak on the right buttock, a prescription for Famvir, Medrol Dosepak, and oxycodone was given, the patient is to follow-up with her regular doctor in 3-5 days if she is not getting better, she can return to the emergency department if worsening, patient states she agrees to the diagnosis and will follow treatment plan, she was discharged in stable condition      ____________________________________________   FINAL CLINICAL IMPRESSION(S) / ED DIAGNOSES  Final diagnoses:  Herpes zoster without complication      NEW MEDICATIONS STARTED DURING THIS VISIT:  This SmartLink is deprecated. Use AVSMEDLIST instead to display the medication list for a patient.   Note:  This document was prepared using Dragon voice recognition software and may include unintentional dictation errors.    Faythe GheeFisher, Brianny Soulliere W, PA-C 08/07/17 1515    Emily FilbertWilliams, Jonathan E, MD 08/08/17  [phone]

## 2017-08-07 NOTE — Discharge Instructions (Signed)
Follow with your doctor if you are not better in 5 days, use medication as prescribed, try not to pick or rub the areas, if you are worsening can return to the emergency department

## 2017-09-09 ENCOUNTER — Other Ambulatory Visit: Payer: Self-pay | Admitting: Ophthalmology

## 2017-09-09 DIAGNOSIS — F419 Anxiety disorder, unspecified: Secondary | ICD-10-CM

## 2017-09-09 DIAGNOSIS — F410 Panic disorder [episodic paroxysmal anxiety] without agoraphobia: Secondary | ICD-10-CM

## 2017-09-22 ENCOUNTER — Ambulatory Visit: Payer: Disability Insurance | Attending: Ophthalmology

## 2017-09-22 DIAGNOSIS — F410 Panic disorder [episodic paroxysmal anxiety] without agoraphobia: Secondary | ICD-10-CM | POA: Insufficient documentation

## 2017-09-22 DIAGNOSIS — F419 Anxiety disorder, unspecified: Secondary | ICD-10-CM | POA: Diagnosis present

## 2017-12-27 ENCOUNTER — Encounter: Payer: Self-pay | Admitting: Emergency Medicine

## 2017-12-27 ENCOUNTER — Other Ambulatory Visit: Payer: Self-pay

## 2017-12-27 ENCOUNTER — Emergency Department
Admission: EM | Admit: 2017-12-27 | Discharge: 2017-12-27 | Disposition: A | Discharge status: Home / Self Care | Payer: Disability Insurance | Attending: Emergency Medicine | Admitting: Emergency Medicine

## 2017-12-27 DIAGNOSIS — Z79899 Other long term (current) drug therapy: Secondary | ICD-10-CM | POA: Insufficient documentation

## 2017-12-27 DIAGNOSIS — F17210 Nicotine dependence, cigarettes, uncomplicated: Secondary | ICD-10-CM | POA: Insufficient documentation

## 2017-12-27 DIAGNOSIS — F329 Major depressive disorder, single episode, unspecified: Secondary | ICD-10-CM | POA: Insufficient documentation

## 2017-12-27 DIAGNOSIS — J340 Abscess, furuncle and carbuncle of nose: Secondary | ICD-10-CM | POA: Insufficient documentation

## 2017-12-27 DIAGNOSIS — B029 Zoster without complications: Secondary | ICD-10-CM | POA: Insufficient documentation

## 2017-12-27 DIAGNOSIS — F419 Anxiety disorder, unspecified: Secondary | ICD-10-CM | POA: Insufficient documentation

## 2017-12-27 MED ORDER — OXYCODONE HCL 5 MG PO TABS: 5.0000 mg | ORAL_TABLET | Freq: Four times a day (QID) | ORAL | 0 refills | Status: DC | PRN

## 2017-12-27 MED ORDER — FAMCICLOVIR 500 MG PO TABS: 500.0000 mg | ORAL_TABLET | Freq: Three times a day (TID) | ORAL | 0 refills | Status: DC

## 2017-12-27 MED ORDER — CEPHALEXIN 500 MG PO CAPS: 500.0000 mg | ORAL_CAPSULE | Freq: Three times a day (TID) | ORAL | 0 refills | Status: DC

## 2017-12-27 NOTE — ED Provider Notes (Signed)
[facility] Emergency Department Provider Note  ____________________________________________   First MD Initiated Contact with Patient 12/27/17 0945     (approximate)  I have reviewed the triage vital signs and the nursing notes.   HISTORY  Chief Complaint Rash   HPI Teresa Wise is a 42 y.o. female here complaint of rash and burning that began yesterday.  Patient states she has had shingles before and this feels similar to the last time.  Patient also complains about an area to her right knee years that is red and tender.  She does not take any over-the-counter medication and rates her pain as a 6 out of 10 at this time.  Past Medical History:  Diagnosis Date  . Anxiety   . Depressed   . History of shingles   . Ovarian cyst     Patient Active Problem List   Diagnosis Date Noted  . 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 Suicidal ideation 04/22/2016    Past Surgical History:  Procedure Laterality Date  . APPENDECTOMY    . TUBAL LIGATION Bilateral   . TUBAL LIGATION      Prior to Admission medications   Medication Sig Start Date End Date Taking? Authorizing Provider  albuterol (PROVENTIL HFA;VENTOLIN HFA) 108 (90 Base) MCG/ACT inhaler Inhale 2 puffs into the lungs every 6 (six) hours as needed for wheezing or shortness of breath. 08/28/16   Tommi Rumps, PA-C  ALPRAZolam Prudy Feeler) 1 MG tablet Take 1 mg by mouth 4 (four) times daily.    [provider]  ALPRAZolam Prudy Feeler) 1 MG tablet Take 1 tablet (1 mg total) by mouth 4 (four) times daily. 01/11/17 01/11/18  Evon Slack, PA-C  cephALEXin (KEFLEX) 500 MG capsule Take 1 capsule (500 mg total) by mouth 3 (three) times daily. 12/27/17   Tommi Rumps, PA-C  famciclovir (FAMVIR) 500 MG tablet Take 1 tablet (500 mg total) by mouth 3 (three) times daily. 12/27/17   Tommi Rumps, PA-C  oxyCODONE (OXY IR/ROXICODONE) 5 MG immediate release tablet  Take 1 tablet (5 mg total) by mouth every 6 (six) hours as needed for severe pain. 12/27/17   Tommi Rumps, PA-C    Allergies Patient has no known allergies.  History reviewed. No pertinent family history.  Social History Social History   Tobacco Use  . Smoking status: Current Every Day Smoker    Packs/day: 1.00    Types: Cigarettes  . Smokeless tobacco: Never Used  Substance Use Topics  . Alcohol use: No  . Drug use: No    Review of Systems Constitutional: No fever/chills Eyes: No visual changes. ENT: Positive for rash. Cardiovascular: Denies chest pain. Respiratory: Denies shortness of breath. Gastrointestinal:   No nausea, no vomiting. Musculoskeletal: Negative for back pain. Skin: Positive for rash. Neurological: Negative for headaches, focal weakness or numbness. ___________________________________________   PHYSICAL EXAM:  VITAL SIGNS: ED Triage Vitals  Enc Vitals Group     BP 12/27/17 0836 (!) 124/94     Pulse Rate 12/27/17 0836 (!) 105     Resp --      Temp 12/27/17 0836 98.1 F (36.7 C)     Temp Source 12/27/17 0836 Oral     SpO2 12/27/17 0836 98 %     Weight 12/27/17 0837 143 lb (64.9 kg)     Height 12/27/17 0837 5' (1.524 m)     Head Circumference --      Peak Flow --  Pain Score 12/27/17 0840 6     Pain Loc --      Pain Edu? --      Excl. in GC? --     Constitutional: Alert and oriented. Well appearing and in no acute distress. Eyes: Conjunctivae are normal.  Head: Atraumatic. Nose: No congestion/rhinnorhea. Right naris erythematous and tender.  Patient has a piercing on the side but states that the ring has been there for some time. Mouth/Throat: Mucous membranes are moist.  Oropharynx non-erythematous. Neck: No stridor.   Cardiovascular: Normal rate, regular rhythm. Grossly normal heart sounds.  Good peripheral circulation. Respiratory: Normal respiratory effort.  No retractions. Lungs CTAB. Musculoskeletal: Moves upper and lower  extremities without any difficulty.  Normal gait was noted. Neurologic:  Normal speech and language. No gross focal neurologic deficits are appreciated.  Skin:  Skin is warm, dry and intact.  There is a linear erythematous area that is extremely tender to touch on the right posterior trunk that is adjacent to her scapula.  No vesicles are noted in this time.  Also right nares is mildly erythematous and tender to touch without vesicles noted.  No drainage is from the site. Psychiatric: Mood and affect are normal. Speech and behavior are normal.  ____________________________________________   LABS (all labs ordered are listed, but only abnormal results are displayed)  Labs Reviewed - No data to display   PROCEDURES  Procedure(s) performed: None  Procedures  Critical Care performed: No  ____________________________________________   INITIAL IMPRESSION / ASSESSMENT AND PLAN / ED COURSE  As part of my medical decision making, I reviewed the following data within the electronic MEDICAL RECORD NUMBER Notes from prior ED visits and [place] Controlled Substance Database  Patient was started on Famvir and given a prescription for oxycodone for her shingles.  She was also given prescription for Keflex 500 mg 3 times daily for area to the nose that a appears to be a cellulitis.  Patient is to follow-up with her PCP if any continued problems.  ____________________________________________   FINAL CLINICAL IMPRESSION(S) / ED DIAGNOSES  Final diagnoses:  Herpes zoster without complication  Cellulitis of external nose     ED Discharge Orders        Ordered    famciclovir (FAMVIR) 500 MG tablet  3 times daily     12/27/17 1005    oxyCODONE (OXY IR/ROXICODONE) 5 MG immediate release tablet  Every 6 hours PRN     12/27/17 1005    cephALEXin (KEFLEX) 500 MG capsule  3 times daily     12/27/17 1006       Note:  This document was prepared using Dragon voice recognition software and may include  unintentional dictation errors.    Tommi Rumps, PA-C 12/27/17 1608    Jene Every, MD 12/29/17 2146

## 2017-12-27 NOTE — Discharge Instructions (Signed)
Begin taking Famvir 3 times daily for the next 7 days.  Oxycodone as needed for severe pain.  You may also take ibuprofen for inflammation and pain.  Also begin taking Keflex 500 mg 3 times daily for the next 7 days for cellulitis of your nose.  Follow-up with your primary care Dr. Yetta Barre if any continued pain medication is needed.

## 2017-12-27 NOTE — ED Triage Notes (Signed)
Pt c/o burning rash to scapula that goes in line.  Feels like when has had shingles. NAD.

## 2017-12-27 NOTE — ED Notes (Signed)
See triage note states she developed a rash to right side of nose and upper back.   Rash is very painful  Hx of shingles

## 2018-08-02 ENCOUNTER — Other Ambulatory Visit: Payer: Self-pay

## 2018-08-02 ENCOUNTER — Encounter: Payer: Self-pay | Admitting: Emergency Medicine

## 2018-08-02 DIAGNOSIS — K8020 Calculus of gallbladder without cholecystitis without obstruction: Secondary | ICD-10-CM | POA: Insufficient documentation

## 2018-08-02 DIAGNOSIS — Z79899 Other long term (current) drug therapy: Secondary | ICD-10-CM | POA: Insufficient documentation

## 2018-08-02 DIAGNOSIS — F17210 Nicotine dependence, cigarettes, uncomplicated: Secondary | ICD-10-CM | POA: Insufficient documentation

## 2018-08-02 NOTE — ED Triage Notes (Signed)
Patient ambulatory to triage with steady gait, without difficulty or distress noted; pt reports mid epigastric pain accomp by nausea since noon today; st hx gallstones

## 2018-08-03 ENCOUNTER — Ambulatory Visit (INDEPENDENT_AMBULATORY_CARE_PROVIDER_SITE_OTHER): Payer: Self-pay | Admitting: Surgery

## 2018-08-03 ENCOUNTER — Emergency Department: Payer: Self-pay

## 2018-08-03 ENCOUNTER — Emergency Department
Admission: EM | Admit: 2018-08-03 | Discharge: 2018-08-03 | Disposition: A | Discharge status: Home / Self Care | Payer: Self-pay | Attending: Emergency Medicine | Admitting: Emergency Medicine

## 2018-08-03 ENCOUNTER — Encounter: Payer: Self-pay | Admitting: Surgery

## 2018-08-03 VITALS — BP 104/79 | HR 85 | Temp 97.0°F | Resp 18 | Ht 62.0 in | Wt 154.0 lb

## 2018-08-03 DIAGNOSIS — R52 Pain, unspecified: Secondary | ICD-10-CM

## 2018-08-03 DIAGNOSIS — K8020 Calculus of gallbladder without cholecystitis without obstruction: Secondary | ICD-10-CM | POA: Insufficient documentation

## 2018-08-03 LAB — CBC WITH DIFFERENTIAL/PLATELET
Abs Immature Granulocytes: 0.05 K/uL (ref 0.00–0.07)
Basophils Absolute: 0.1 K/uL (ref 0.0–0.1)
Basophils Relative: 1 %
Eosinophils Absolute: 0.2 K/uL (ref 0.0–0.5)
Eosinophils Relative: 2 %
HCT: 42.9 % (ref 36.0–46.0)
Hemoglobin: 13.9 g/dL (ref 12.0–15.0)
Immature Granulocytes: 1 %
Lymphocytes Relative: 38 %
Lymphs Abs: 4.1 K/uL — ABNORMAL HIGH (ref 0.7–4.0)
MCH: 28.8 pg (ref 26.0–34.0)
MCHC: 32.4 g/dL (ref 30.0–36.0)
MCV: 88.8 fL (ref 80.0–100.0)
Monocytes Absolute: 0.7 K/uL (ref 0.1–1.0)
Monocytes Relative: 7 %
Neutro Abs: 5.6 K/uL (ref 1.7–7.7)
Neutrophils Relative %: 51 %
Platelets: 387 K/uL (ref 150–400)
RBC: 4.83 MIL/uL (ref 3.87–5.11)
RDW: 12.7 % (ref 11.5–15.5)
WBC: 10.7 K/uL — ABNORMAL HIGH (ref 4.0–10.5)
nRBC: 0 % (ref 0.0–0.2)

## 2018-08-03 LAB — COMPREHENSIVE METABOLIC PANEL
ALT: 13 U/L (ref 0–44)
AST: 13 U/L — ABNORMAL LOW (ref 15–41)
Albumin: 3.6 g/dL (ref 3.5–5.0)
Alkaline Phosphatase: 55 U/L (ref 38–126)
Anion gap: 5 (ref 5–15)
BILIRUBIN TOTAL: 0.4 mg/dL (ref 0.3–1.2)
BUN: 13 mg/dL (ref 6–20)
CO2: 26 mmol/L (ref 22–32)
CREATININE: 0.74 mg/dL (ref 0.44–1.00)
Calcium: 8.8 mg/dL — ABNORMAL LOW (ref 8.9–10.3)
Chloride: 107 mmol/L (ref 98–111)
GFR calc Af Amer: >60 mL/min (ref >60)
Glucose, Bld: 104 mg/dL — ABNORMAL HIGH (ref 70–99)
POTASSIUM: 3.6 mmol/L (ref 3.5–5.1)
Sodium: 138 mmol/L (ref 135–145)
TOTAL PROTEIN: 6.4 g/dL — AB (ref 6.5–8.1)

## 2018-08-03 LAB — URINALYSIS, COMPLETE (UACMP) WITH MICROSCOPIC
Bilirubin Urine: NEGATIVE
Glucose, UA: NEGATIVE mg/dL
Hgb urine dipstick: NEGATIVE
Ketones, ur: NEGATIVE mg/dL
Leukocytes, UA: NEGATIVE
Nitrite: NEGATIVE
Protein, ur: NEGATIVE mg/dL
Specific Gravity, Urine: 1.018 (ref 1.005–1.030)
pH: 6 (ref 5.0–8.0)

## 2018-08-03 LAB — POCT PREGNANCY, URINE: Preg Test, Ur: NEGATIVE

## 2018-08-03 LAB — LIPASE, BLOOD: Lipase: 28 U/L (ref 11–51)

## 2018-08-03 LAB — TROPONIN I: Troponin I: <0.03 ng/mL (ref <0.03)

## 2018-08-03 MED ORDER — OXYCODONE-ACETAMINOPHEN 5-325 MG PO TABS: 1.0000 | ORAL_TABLET | ORAL | 0 refills | Status: DC | PRN

## 2018-08-03 MED ORDER — SODIUM CHLORIDE 0.9 % IV BOLUS
1000.0000 mL | Freq: Once | INTRAVENOUS | Status: AC
  Administered 2018-08-03: 1000 mL via INTRAVENOUS

## 2018-08-03 MED ORDER — MORPHINE SULFATE (PF) 4 MG/ML IV SOLN
4.0000 mg | Freq: Once | INTRAVENOUS | Status: AC
  Administered 2018-08-03: 2 mg via INTRAVENOUS
  Filled 2018-08-03: qty 1

## 2018-08-03 MED ORDER — ONDANSETRON HCL 4 MG/2ML IJ SOLN
4.0000 mg | Freq: Once | INTRAMUSCULAR | Status: AC
  Administered 2018-08-03: 4 mg via INTRAVENOUS
  Filled 2018-08-03: qty 2

## 2018-08-03 NOTE — ED Notes (Signed)
Charge nurse notified of u/s results, bed requested

## 2018-08-03 NOTE — ED Notes (Signed)
ED Provider at bedside. 

## 2018-08-03 NOTE — ED Notes (Signed)
E-signature pad not working in room; pt signed paper copy for scanning into EMR.

## 2018-08-03 NOTE — H&P (View-Only) (Signed)
08/03/2018  Reason for Visit:  Symptomatic cholelithiasis  History of Present Illness: Teresa Wise is a 42 y.o. female who presented to the ED yesterday for abdominal pain.  The patient had a 1 day history of RUQ abdominal pain, associated with nausea.  Reports the pain radiated to her back.  Denies any fevers, chills, chest pain, shortness of breath.  Reports that she has had intermittent episodes of RUQ pain but would only last a short time.  In the ED, her workup included labs and ultrasound.  I have independently viewed the patient's imaging study and laboratory studies.  Her ultrasound showed a stone in the neck of the gallbladder, without any gallbladder wall thickening of pericholecystic fluid.  On ultrasound, she did have a sonographic Murphy's sign.  Her WBC was just mildly elevated at 10.7.  Discussed with Dr. Pershing Proud that if her pain were controlled and she felt better, we could see her today in the office.  She did receive a dose of morphine and zofran and improved and was discharged.  She reports her pain is still gone and has not worsened since yesterday.  She denies having any jaundice.  She has tried low fat diet in the past.  Past Medical History: Past Medical History:  Diagnosis Date  . Anxiety   . Depressed   . History of shingles   . Ovarian cyst      Past Surgical History: Past Surgical History:  Procedure Laterality Date  . APPENDECTOMY    . TUBAL LIGATION Bilateral   . TUBAL LIGATION      Home Medications: Prior to Admission medications   Medication Sig Start Date End Date Taking? Authorizing Provider  albuterol (PROVENTIL HFA;VENTOLIN HFA) 108 (90 Base) MCG/ACT inhaler Inhale 2 puffs into the lungs every 6 (six) hours as needed for wheezing or shortness of breath. 08/28/16  Yes Tommi Rumps, PA-C  ALPRAZolam Prudy Feeler) 1 MG tablet Take 1 mg by mouth 4 (four) times daily.   Yes [provider]  famciclovir (FAMVIR) 500 MG tablet Take 1 tablet (500 mg  total) by mouth 3 (three) times daily. 12/27/17  Yes Tommi Rumps, PA-C  oxyCODONE-acetaminophen (PERCOCET) 5-325 MG tablet Take 1 tablet by mouth every 4 (four) hours as needed for moderate pain or severe pain. 08/03/18 08/03/19 Yes Schaevitz, Myra Rude, MD    Allergies: No Known Allergies  Social History:  reports that she has been smoking cigarettes. She has been smoking about 1.00 pack per day. She has never used smokeless tobacco. She reports that she does not drink alcohol or use drugs.   Family History: History reviewed. No pertinent family history.  Review of Systems: Review of Systems  Constitutional: Negative for fever.  HENT: Negative for hearing loss.   Respiratory: Negative for shortness of breath.   Cardiovascular: Negative for chest pain.  Gastrointestinal: Positive for abdominal pain and nausea. Negative for constipation, diarrhea and vomiting.  Genitourinary: Negative for dysuria.  Musculoskeletal: Negative for myalgias.  Skin: Negative for rash.  Neurological: Negative for dizziness.  Psychiatric/Behavioral: Negative for depression.    Physical Exam BP 104/79   Pulse 85   Temp (!) 97 F (36.1 C) (Temporal)   Resp 18   Ht 5\' 2"  (1.575 m)   Wt 154 lb (69.9 kg)   LMP 07/26/2018 (Exact Date)   SpO2 99%   BMI 28.17 kg/m  CONSTITUTIONAL: No acute distress HEENT:  Normocephalic, atraumatic, extraocular motion intact. NECK: Trachea is midline, and there is no  jugular venous distension.  RESPIRATORY:  Lungs are clear, and breath sounds are equal bilaterally. Normal respiratory effort without pathologic use of accessory muscles. CARDIOVASCULAR: Heart is regular without murmurs, gallops, or rubs. GI: The abdomen is soft, non-distended, currently with only some soreness in the epigastric and right upper quadrant.  Negative Murphy's sign.  Prior umbilical incision well healed.   MUSCULOSKELETAL:  Normal muscle strength and tone in all four extremities.  No  peripheral edema or cyanosis. SKIN: Skin turgor is normal. There are no pathologic skin lesions.  NEUROLOGIC:  Motor and sensation is grossly normal.  Cranial nerves are grossly intact. PSYCH:  Alert and oriented to person, place and time. Affect is normal.  Laboratory Analysis: Results for orders placed or performed during the hospital encounter of 08/03/18 (from the past 24 hour(s))  CBC with Differential     Status: Abnormal   Collection Time: 08/02/18 11:57 PM  Result Value Ref Range   WBC 10.7 (H) 4.0 - 10.5 K/uL   RBC 4.83 3.87 - 5.11 MIL/uL   Hemoglobin 13.9 12.0 - 15.0 g/dL   HCT 16.142.9 09.636.0 - 04.546.0 %   MCV 88.8 80.0 - 100.0 fL   MCH 28.8 26.0 - 34.0 pg   MCHC 32.4 30.0 - 36.0 g/dL   RDW 40.912.7 81.111.5 - 91.415.5 %   Platelets 387 150 - 400 K/uL   nRBC 0.0 0.0 - 0.2 %   Neutrophils Relative % 51 %   Neutro Abs 5.6 1.7 - 7.7 K/uL   Lymphocytes Relative 38 %   Lymphs Abs 4.1 (H) 0.7 - 4.0 K/uL   Monocytes Relative 7 %   Monocytes Absolute 0.7 0.1 - 1.0 K/uL   Eosinophils Relative 2 %   Eosinophils Absolute 0.2 0.0 - 0.5 K/uL   Basophils Relative 1 %   Basophils Absolute 0.1 0.0 - 0.1 K/uL   Immature Granulocytes 1 %   Abs Immature Granulocytes 0.05 0.00 - 0.07 K/uL  Comprehensive metabolic panel     Status: Abnormal   Collection Time: 08/02/18 11:57 PM  Result Value Ref Range   Sodium 138 135 - 145 mmol/L   Potassium 3.6 3.5 - 5.1 mmol/L   Chloride 107 98 - 111 mmol/L   CO2 26 22 - 32 mmol/L   Glucose, Bld 104 (H) 70 - 99 mg/dL   BUN 13 6 - 20 mg/dL   Creatinine, Ser 7.820.74 0.44 - 1.00 mg/dL   Calcium 8.8 (L) 8.9 - 10.3 mg/dL   Total Protein 6.4 (L) 6.5 - 8.1 g/dL   Albumin 3.6 3.5 - 5.0 g/dL   AST 13 (L) 15 - 41 U/L   ALT 13 0 - 44 U/L   Alkaline Phosphatase 55 38 - 126 U/L   Total Bilirubin 0.4 0.3 - 1.2 mg/dL   GFR calc non Af Amer >60 >60 mL/min   GFR calc Af Amer >60 >60 mL/min   Anion gap 5 5 - 15  Lipase, blood     Status: None   Collection Time: 08/02/18 11:57 PM   Result Value Ref Range   Lipase 28 11 - 51 U/L  Urinalysis, Complete w Microscopic     Status: Abnormal   Collection Time: 08/02/18 11:57 PM  Result Value Ref Range   Color, Urine YELLOW (A) YELLOW   APPearance CLOUDY (A) CLEAR   Specific Gravity, Urine 1.018 1.005 - 1.030   pH 6.0 5.0 - 8.0   Glucose, UA NEGATIVE NEGATIVE mg/dL   Hgb urine dipstick  NEGATIVE NEGATIVE   Bilirubin Urine NEGATIVE NEGATIVE   Ketones, ur NEGATIVE NEGATIVE mg/dL   Protein, ur NEGATIVE NEGATIVE mg/dL   Nitrite NEGATIVE NEGATIVE   Leukocytes, UA NEGATIVE NEGATIVE   RBC / HPF 0-5 0 - 5 RBC/hpf   WBC, UA 0-5 0 - 5 WBC/hpf   Bacteria, UA RARE (A) NONE SEEN   Squamous Epithelial / LPF 21-50 0 - 5   Mucus PRESENT   Troponin I - ONCE - STAT     Status: None   Collection Time: 08/02/18 11:57 PM  Result Value Ref Range   Troponin I <0.03 <0.03 ng/mL  Pregnancy, urine POC     Status: None   Collection Time: 08/02/18 11:59 PM  Result Value Ref Range   Preg Test, Ur NEGATIVE NEGATIVE    Imaging: US Abdomen Limited Ruq  Result Date: 08/03/2018 CLINICAL DATA:  Initial evaluation for acute right upper quadrant pain. EXAM: ULTRASOUND ABDOMEN LIMITED RIGHT UPPER QUADRANT COMPARISON:  Prior CT from 06/28/2014. FINDINGS: Gallbladder: Multiple stones present within the gallbladder lumen, largest of which measures 1.5 mm. Small amount of associated sludge. A 1.2 cm stone appears lodged in the gallbladder neck. Gallbladder wall measures within normal limits at 2.3 mm. No free pericholecystic fluid. Positive sonographic Murphy sign elicited on exam. Common bile duct: Diameter: 2 mm Liver: No focal lesion identified. Within normal limits in parenchymal echogenicity. Portal vein is patent on color Doppler imaging with normal direction of blood flow towards the liver. IMPRESSION: 1. Cholelithiasis with 1.2 cm stone lodged within the gallbladder neck, with positive sonographic Murphy sign. Clinical correlation for possible acute  cholecystitis recommended. 2. No biliary dilatation. Electronically Signed   By: Rise Mu M.D.   On: 08/03/2018 01:51    Assessment and Plan: This is a 42 y.o. female with symptomatic cholelithiasis.  Discussed with the patient that the next step in management consists of surgery.  Discussed with her the role of laparoscopic cholecystectomy, post-op outcomes, restrictions, and recovery.  Discussed with her the risks of bleeding, infection, and injury to surrounding structures, including possibility of open procedure.  She understands this and is willing to proceed.  She smokes a pack of cigarettes per day and have advised her to quit smoking to help with wound healing after surgery.  Will plan on doing her surgery on 08/30/2018.  Patient understands that if any worsening pain again, she should present to the hospital ED for further evaluation and possible admission/surgery.  Face-to-face time spent with the patient and care providers was 45 minutes, with more than 50% of the time spent counseling, educating, and coordinating care of the patient.     Howie Ill, MD [facility]

## 2018-08-03 NOTE — ED Notes (Signed)
Report off to butch rn  

## 2018-08-03 NOTE — Patient Instructions (Addendum)
You have requested to have your gallbladder removed. This will be done on 08/30/2018 at [facility] with Dr. Henrene Dodge. You will pre admit by phone. The patient is aware of date and instructions.   You will most likely be out of work 1-2 weeks for this surgery. You will return after your post-op appointment with a lifting restriction for approximately 4 more weeks.  You will be able to eat anything you would like to following surgery. But, start by eating a bland diet and advance this as tolerated. The Gallbladder diet is below, please go as closely by this diet as possible prior to surgery to avoid any further attacks.  .  Laparoscopic Cholecystectomy Laparoscopic cholecystectomy is surgery to remove the gallbladder. The gallbladder is located in the upper right part of the abdomen, behind the liver. It is a storage sac for bile, which is produced in the liver. Bile aids in the digestion and absorption of fats. Cholecystectomy is often done for inflammation of the gallbladder (cholecystitis). This condition is usually caused by a buildup of gallstones (cholelithiasis) in the gallbladder. Gallstones can block the flow of bile, and that can result in inflammation and pain. In severe cases, emergency surgery may be required. If emergency surgery is not required, you will have time to prepare for the procedure. Laparoscopic surgery is an alternative to open surgery. Laparoscopic surgery has a shorter recovery time. Your common bile duct may also need to be examined during the procedure. If stones are found in the common bile duct, they may be removed. LET [facility] CARE PROVIDER KNOW ABOUT:  Any allergies you have.  All medicines you are taking, including vitamins, herbs, eye drops, creams, and over-the-counter medicines.  Previous problems you or members of your family have had with the use of anesthetics.  Any blood disorders you have.  Previous surgeries you have had.    Any medical  conditions you have. RISKS AND COMPLICATIONS Generally, this is a safe procedure. However, problems may occur, including:  Infection.  Bleeding.  Allergic reactions to medicines.  Damage to other structures or organs.  A stone remaining in the common bile duct.  A bile leak from the cyst duct that is clipped when your gallbladder is removed.  The need to convert to open surgery, which requires a larger incision in the abdomen. This may be necessary if your surgeon thinks that it is not safe to continue with a laparoscopic procedure. BEFORE THE PROCEDURE  Ask your health care provider about:  Changing or stopping your regular medicines. This is especially important if you are taking diabetes medicines or blood thinners.  Taking medicines such as aspirin and ibuprofen. These medicines can thin your blood. Do not take these medicines before your procedure if your health care provider instructs you not to.  Follow instructions from your health care provider about eating or drinking restrictions.  Let your health care provider know if you develop a cold or an infection before surgery.  Plan to have someone take you home after the procedure.  Ask your health care provider how your surgical site will be marked or identified.  You may be given antibiotic medicine to help prevent infection. PROCEDURE  To reduce your risk of infection:  Your health care team will wash or sanitize their hands.  Your skin will be washed with soap.  An IV tube may be inserted into one of your veins.  You will be given a medicine to make you fall asleep (  general anesthetic).  A breathing tube will be placed in your mouth.  The surgeon will make several small cuts (incisions) in your abdomen.  A thin, lighted tube (laparoscope) that has a tiny camera on the end will be inserted through one of the small incisions. The camera on the laparoscope will send a picture to a TV screen (monitor) in the  operating room. This will give the surgeon a good view inside your abdomen.  A gas will be pumped into your abdomen. This will expand your abdomen to give the surgeon more room to perform the surgery.  Other tools that are needed for the procedure will be inserted through the other incisions. The gallbladder will be removed through one of the incisions.  After your gallbladder has been removed, the incisions will be closed with stitches (sutures), staples, or skin glue.  Your incisions may be covered with a bandage (dressing). The procedure may vary among health care providers and hospitals. AFTER THE PROCEDURE  Your blood pressure, heart rate, breathing rate, and blood oxygen level will be monitored often until the medicines you were given have worn off.  You will be given medicines as needed to control your pain.   This information is not intended to replace advice given to you by your health care provider. Make sure you discuss any questions you have with your health care provider.   Document Released: 08/11/2005 Document Revised: 05/02/2015 Document Reviewed: 03/23/2013 Elsevier Interactive Patient Education 2016 Elsevier Inc.   Low-Fat Diet for Gallbladder Conditions A low-fat diet can be helpful if you have pancreatitis or a gallbladder condition. With these conditions, your pancreas and gallbladder have trouble digesting fats. A healthy eating plan with less fat will help rest your pancreas and gallbladder and reduce your symptoms. WHAT DO I NEED TO KNOW ABOUT THIS DIET?  Eat a low-fat diet.  Reduce your fat intake to less than 20-30% of your total daily calories. This is less than 50-60 g of fat per day.  Remember that you need some fat in your diet. Ask your dietician what your daily goal should be.  Choose nonfat and low-fat healthy foods. Look for the words "nonfat," "low fat," or "fat free."  As a guide, look on the label and choose foods with less than 3 g of fat per  serving. Eat only one serving.  Avoid alcohol.  Do not smoke. If you need help quitting, talk with your health care provider.  Eat small frequent meals instead of three large heavy meals. WHAT FOODS CAN I EAT? Grains Include healthy grains and starches such as potatoes, wheat bread, fiber-rich cereal, and brown rice. Choose whole grain options whenever possible. In adults, whole grains should account for 45-65% of your daily calories.  Fruits and Vegetables Eat plenty of fruits and vegetables. Fresh fruits and vegetables add fiber to your diet. Meats and Other Protein Sources Eat lean meat such as chicken and pork. Trim any fat off of meat before cooking it. Eggs, fish, and beans are other sources of protein. In adults, these foods should account for 10-35% of your daily calories. Dairy Choose low-fat milk and dairy options. Dairy includes fat and protein, as well as calcium.  Fats and Oils Limit high-fat foods such as fried foods, sweets, baked goods, sugary drinks.  Other Creamy sauces and condiments, such as mayonnaise, can add extra fat. Think about whether or not you need to use them, or use smaller amounts or low fat options. WHAT FOODS ARE  NOT RECOMMENDED?  High fat foods, such as:  [employer].  Ice cream.  [place] toast.  Sweet rolls.  Pizza.  Cheese bread.  Foods covered with batter, butter, creamy sauces, or cheese.  Fried foods.  Sugary drinks and desserts.  Foods that cause gas or bloating   This information is not intended to replace advice given to you by your health care provider. Make sure you discuss any questions you have with your health care provider.   Document Released: 08/16/2013 Document Reviewed: 08/16/2013 Elsevier Interactive Patient Education [employer]           Cholelithiasis Cholelithiasis is also called "gallstones." It is a kind of gallbladder disease. The gallbladder is an organ that stores a liquid (bile) that  helps you digest fat. Gallstones may not cause symptoms (may be silent gallstones) until they cause a blockage, and then they can cause pain (gallbladder attack). Follow these instructions at home:  Take over-the-counter and prescription medicines only as told by your doctor.  Stay at a healthy weight.  Eat healthy foods. This includes: ? Eating fewer fatty foods, like fried foods. ? Eating fewer refined carbs (refined carbohydrates). Refined carbs are breads and grains that are highly processed, like white bread and white rice. Instead, choose whole grains like whole-wheat bread and brown rice. ? Eating more fiber. Almonds, fresh fruit, and beans are healthy sources of fiber.  Keep all follow-up visits as told by your doctor. This is important. Contact a doctor if:  You have sudden pain in the upper right side of your belly (abdomen). Pain might spread to your right shoulder or your chest. This may be a sign of a gallbladder attack.  You feel sick to your stomach (are nauseous).  You throw up (vomit).  You have been diagnosed with gallstones that have no symptoms and you get: ? Belly pain. ? Discomfort, burning, or fullness in the upper part of your belly (indigestion). Get help right away if:  You have sudden pain in the upper right side of your belly, and it lasts for more than 2 hours.  You have belly pain that lasts for more than 5 hours.  You have a fever or chills.  You keep feeling sick to your stomach or you keep throwing up. Your skin or the whites of your eyes turn yellow (jaundice).    Low-Fat Diet for Pancreatitis or Gallbladder Conditions A low-fat diet can be helpful if you have pancreatitis or a gallbladder condition. With these conditions, your pancreas and gallbladder have trouble digesting fats. A healthy eating plan with less fat will help rest your pancreas and gallbladder and reduce your symptoms. What do I need to know about this diet?  Eat a low-fat  diet. ? Reduce your fat intake to less than 20-30% of your total daily calories. This is less than 50-60 g of fat per day. ? Remember that you need some fat in your diet. Ask your dietician what your daily goal should be. ? Choose nonfat and low-fat healthy foods. Look for the words "nonfat," "low fat," or "fat free." ? As a guide, look on the label and choose foods with less than 3 g of fat per serving. Eat only one serving.  Avoid alcohol.  Do not smoke. If you need help quitting, talk with your health care provider.  Eat small frequent meals instead of three large heavy meals. What foods can I eat? Grains Include healthy grains and starches such as potatoes, wheat  bread, fiber-rich cereal, and brown rice. Choose whole grain options whenever possible. In adults, whole grains should account for 45-65% of your daily calories. Fruits and Vegetables Eat plenty of fruits and vegetables. Fresh fruits and vegetables add fiber to your diet. Meats and Other Protein Sources Eat lean meat such as chicken and pork. Trim any fat off of meat before cooking it. Eggs, fish, and beans are other sources of protein. In adults, these foods should account for 10-35% of your daily calories. Dairy Choose low-fat milk and dairy options. Dairy includes fat and protein, as well as calcium. Fats and Oils Limit high-fat foods such as fried foods, sweets, baked goods, sugary drinks. Other Creamy sauces and condiments, such as mayonnaise, can add extra fat. Think about whether or not you need to use them, or use smaller amounts or low fat options. What foods are not recommended?  High fat foods, such as: ? [employer]. ? Ice cream. ? [place] toast. ? Sweet rolls. ? Pizza. ? Cheese bread. ? Foods covered with batter, butter, creamy sauces, or cheese. ? Fried foods. ? Sugary drinks and desserts.  Foods that cause gas or bloating This information is not intended to replace advice given to you by your health  care provider. Make sure you discuss any questions you have with your health care provider. Document Released: 08/16/2013 Document Revised: 01/17/2016 Document Reviewed: 07/25/2013 Elsevier Interactive Patient Education  2017 Elsevier Inc.    You have dark-colored pee (urine).  You have light-colored poop (stool). Summary  Cholelithiasis is also called "gallstones."  The gallbladder is an organ that stores a liquid (bile) that helps you digest fat.  Silent gallstones are gallstones that do not cause symptoms.  A gallbladder attack may cause sudden pain in the upper right side of your belly. Pain might spread to your right shoulder or your chest. If this happens, contact your doctor.  If you have sudden pain in the upper right side of your belly that lasts for more than 2 hours, get help right away. This information is not intended to replace advice given to you by your health care provider. Make sure you discuss any questions you have with your health care provider. Document Released: 01/28/2008 Document Revised: 04/27/2016 Document Reviewed: 04/27/2016 Elsevier Interactive Patient Education  2017 [employer]

## 2018-08-03 NOTE — ED Provider Notes (Signed)
[facility] Emergency Department Provider Note  ___________________________________________   First MD Initiated Contact with Patient 08/03/18 [phone]     (approximate)  I have reviewed the triage vital signs and the nursing notes.   HISTORY  Chief Complaint Abdominal Pain   HPI Toba Claudio is a 42 y.o. female with a history of gallstones who is presenting with right upper quadrant pain over the past 24 hours.  Says that she has known gallstones because she has had right upper quadrant pain that is been intermittent but usually last for about half an hour.  However, she is had constant pain which is an 8 out of 10 over the past 24 hours.  Does not report any fever or chills.  However, does report nausea.  Also states that the pain radiates through to her back.    Past Medical History:  Diagnosis Date  . Anxiety   . Depressed   . History of shingles   . Ovarian cyst     Patient Active Problem List   Diagnosis Date Noted  . 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 Suicidal ideation 04/22/2016    Past Surgical History:  Procedure Laterality Date  . APPENDECTOMY    . TUBAL LIGATION Bilateral   . TUBAL LIGATION      Prior to Admission medications   Medication Sig Start Date End Date Taking? Authorizing Provider  albuterol (PROVENTIL HFA;VENTOLIN HFA) 108 (90 Base) MCG/ACT inhaler Inhale 2 puffs into the lungs every 6 (six) hours as needed for wheezing or shortness of breath. 08/28/16   Tommi Rumps, PA-C  ALPRAZolam Prudy Feeler) 1 MG tablet Take 1 mg by mouth 4 (four) times daily.    [provider]  cephALEXin (KEFLEX) 500 MG capsule Take 1 capsule (500 mg total) by mouth 3 (three) times daily. 12/27/17   Tommi Rumps, PA-C  famciclovir (FAMVIR) 500 MG tablet Take 1 tablet (500 mg total) by mouth 3 (three) times daily. 12/27/17   Tommi Rumps, PA-C  oxyCODONE (OXY IR/ROXICODONE) 5 MG immediate  release tablet Take 1 tablet (5 mg total) by mouth every 6 (six) hours as needed for severe pain. 12/27/17   Tommi Rumps, PA-C    Allergies Patient has no known allergies.  No family history on file.  Social History Social History   Tobacco Use  . Smoking status: Current Every Day Smoker    Packs/day: 1.00    Types: Cigarettes  . Smokeless tobacco: Never Used  Substance Use Topics  . Alcohol use: No  . Drug use: No    Review of Systems  Constitutional: No fever/chills Eyes: No visual changes. ENT: No sore throat. Cardiovascular: Denies chest pain. Respiratory: Denies shortness of breath. Gastrointestinal:  no vomiting.  No diarrhea.  No constipation. Genitourinary: Negative for dysuria. Musculoskeletal: Negative for back pain. Skin: Negative for rash. Neurological: Negative for headaches, focal weakness or numbness.   ____________________________________________   PHYSICAL EXAM:  VITAL SIGNS: ED Triage Vitals  Enc Vitals Group     BP 08/02/18 2351 120/81     Pulse Rate 08/02/18 2351 (!) 109     Resp 08/02/18 2351 18     Temp 08/02/18 2351 97.7 F (36.5 C)     Temp Source 08/02/18 2351 Oral     SpO2 08/02/18 2351 99 %     Weight 08/02/18 2353 150 lb (68 kg)     Height 08/02/18 2353 5' (1.524 m)  Head Circumference --      Peak Flow --      Pain Score 08/02/18 2353 9     Pain Loc --      Pain Edu? --      Excl. in GC? --     Constitutional: Alert and oriented. Well appearing and in no acute distress. Eyes: Conjunctivae are normal.  Head: Atraumatic. Nose: No congestion/rhinnorhea. Mouth/Throat: Mucous membranes are moist.  Neck: No stridor.   Cardiovascular: Normal rate, regular rhythm. Grossly normal heart sounds.  Heart rate in the room is 82 bpm. Respiratory: Normal respiratory effort.  No retractions. Lungs CTAB. Gastrointestinal: Soft with moderate right upper quadrant tenderness to palpation with a negative Murphy sign.  No distention.   Mild right CVA tenderness to palpation. Musculoskeletal: No lower extremity tenderness nor edema.  No joint effusions. Neurologic:  Normal speech and language. No gross focal neurologic deficits are appreciated. Skin:  Skin is warm, dry and intact. No rash noted. Psychiatric: Mood and affect are normal. Speech and behavior are normal.  ____________________________________________   LABS (all labs ordered are listed, but only abnormal results are displayed)  Labs Reviewed  CBC WITH DIFFERENTIAL/PLATELET - Abnormal; Notable for the following components:      Result Value   WBC 10.7 (*)    Lymphs Abs 4.1 (*)    All other components within normal limits  COMPREHENSIVE METABOLIC PANEL - Abnormal; Notable for the following components:   Glucose, Bld 104 (*)    Calcium 8.8 (*)    Total Protein 6.4 (*)    AST 13 (*)    All other components within normal limits  URINALYSIS, COMPLETE (UACMP) WITH MICROSCOPIC - Abnormal; Notable for the following components:   Color, Urine YELLOW (*)    APPearance CLOUDY (*)    Bacteria, UA RARE (*)    All other components within normal limits  LIPASE, BLOOD  TROPONIN I  POCT PREGNANCY, URINE   ____________________________________________  EKG  ED ECG REPORT I, Arelia Longest, the attending physician, personally viewed and interpreted this ECG.   Date: 08/03/2018  EKG Time: 0004  Rate: 96  Rhythm: normal sinus rhythm  Axis: Normal  Intervals:none  ST&T Change: No ST segment elevation or depression.  No abnormal T wave inversion.  ____________________________________________  RADIOLOGY  Ultrasound of the right upper quadrant with cholelithiasis with a 1.2 cm stone lodged within the gallbladder neck.  Positive sonographic Murphy sign. ____________________________________________   PROCEDURES  Procedure(s) performed: None  Procedures  Critical Care performed: No  ____________________________________________   INITIAL IMPRESSION  / ASSESSMENT AND PLAN / ED COURSE  Pertinent labs & imaging results that were available during my care of the patient were reviewed by me and considered in my medical decision making (see chart for details).  Differential diagnosis includes, but is not limited to, biliary disease (biliary colic, acute cholecystitis, cholangitis, choledocholithiasis, etc), intrathoracic causes for epigastric abdominal pain including ACS, gastritis, duodenitis, pancreatitis, small bowel or large bowel obstruction, abdominal aortic aneurysm, hernia, and ulcer(s). As part of my medical decision making, I reviewed the following data within the electronic MEDICAL RECORD NUMBER Notes from prior ED visits  ----------------------------------------- 3:29 AM on 08/03/2018 -----------------------------------------  Discussed case with Dr. Aleen Campi who recommends pain medicine and reevaluation.  Patient to be reassessed.  If patient is pain-free will be discharged with Percocet and will follow at 10:30 in the morning tomorrow with Dr. Aleen Campi in the office in [place].  If the patient is still  in discomfort she will be admitted to the hospital.  ----------------------------------------- 4:11 AM on 08/03/2018 -----------------------------------------  Patient at this time with pain relieved after morphine.  Heart rate of 72 bpm on the monitor in the room.  Patient says that she would like to follow-up in the office at 10:30 AM.  I believe this is an appropriate plan.  She will be given the medicine contact information for Dr. Aleen CampiPiscoya.  She will be discharged at this time.  To return for any worsening or concerning symptoms. ____________________________________________   FINAL CLINICAL IMPRESSION(S) / ED DIAGNOSES  Final diagnoses:  Pain  Cholelithiasis.    NEW MEDICATIONS STARTED DURING THIS VISIT:  New Prescriptions   No medications on file     Note:  This document was prepared using Dragon voice recognition software  and may include unintentional dictation errors.     Myrna BlazerSchaevitz, David Matthew, MD 08/03/18 [phone]

## 2018-08-03 NOTE — Progress Notes (Signed)
08/03/2018  Reason for Visit:  Symptomatic cholelithiasis  History of Present Illness: Teresa Wise is a 42 y.o. female who presented to the ED yesterday for abdominal pain.  The patient had a 1 day history of RUQ abdominal pain, associated with nausea.  Reports the pain radiated to her back.  Denies any fevers, chills, chest pain, shortness of breath.  Reports that she has had intermittent episodes of RUQ pain but would only last a short time.  In the ED, her workup included labs and ultrasound.  I have independently viewed the patient's imaging study and laboratory studies.  Her ultrasound showed a stone in the neck of the gallbladder, without any gallbladder wall thickening of pericholecystic fluid.  On ultrasound, she did have a sonographic Murphy's sign.  Her WBC was just mildly elevated at 10.7.  Discussed with Dr. Schaevitz that if her pain were controlled and she felt better, we could see her today in the office.  She did receive a dose of morphine and zofran and improved and was discharged.  She reports her pain is still gone and has not worsened since yesterday.  She denies having any jaundice.  She has tried low fat diet in the past.  Past Medical History: Past Medical History:  Diagnosis Date  . Anxiety   . Depressed   . History of shingles   . Ovarian cyst      Past Surgical History: Past Surgical History:  Procedure Laterality Date  . APPENDECTOMY    . TUBAL LIGATION Bilateral   . TUBAL LIGATION      Home Medications: Prior to Admission medications   Medication Sig Start Date End Date Taking? Authorizing Provider  albuterol (PROVENTIL HFA;VENTOLIN HFA) 108 (90 Base) MCG/ACT inhaler Inhale 2 puffs into the lungs every 6 (six) hours as needed for wheezing or shortness of breath. 08/28/16  Yes Summers, Rhonda L, PA-C  ALPRAZolam (XANAX) 1 MG tablet Take 1 mg by mouth 4 (four) times daily.   Yes [provider]  famciclovir (FAMVIR) 500 MG tablet Take 1 tablet (500 mg  total) by mouth 3 (three) times daily. 12/27/17  Yes Summers, Rhonda L, PA-C  oxyCODONE-acetaminophen (PERCOCET) 5-325 MG tablet Take 1 tablet by mouth every 4 (four) hours as needed for moderate pain or severe pain. 08/03/18 08/03/19 Yes Schaevitz, David Matthew, MD    Allergies: No Known Allergies  Social History:  reports that she has been smoking cigarettes. She has been smoking about 1.00 pack per day. She has never used smokeless tobacco. She reports that she does not drink alcohol or use drugs.   Family History: History reviewed. No pertinent family history.  Review of Systems: Review of Systems  Constitutional: Negative for fever.  HENT: Negative for hearing loss.   Respiratory: Negative for shortness of breath.   Cardiovascular: Negative for chest pain.  Gastrointestinal: Positive for abdominal pain and nausea. Negative for constipation, diarrhea and vomiting.  Genitourinary: Negative for dysuria.  Musculoskeletal: Negative for myalgias.  Skin: Negative for rash.  Neurological: Negative for dizziness.  Psychiatric/Behavioral: Negative for depression.    Physical Exam BP 104/79   Pulse 85   Temp (!) 97 F (36.1 C) (Temporal)   Resp 18   Ht 5' 2" (1.575 m)   Wt 154 lb (69.9 kg)   LMP 07/26/2018 (Exact Date)   SpO2 99%   BMI 28.17 kg/m  CONSTITUTIONAL: No acute distress HEENT:  Normocephalic, atraumatic, extraocular motion intact. NECK: Trachea is midline, and there is no   jugular venous distension.  RESPIRATORY:  Lungs are clear, and breath sounds are equal bilaterally. Normal respiratory effort without pathologic use of accessory muscles. CARDIOVASCULAR: Heart is regular without murmurs, gallops, or rubs. GI: The abdomen is soft, non-distended, currently with only some soreness in the epigastric and right upper quadrant.  Negative Murphy's sign.  Prior umbilical incision well healed.   MUSCULOSKELETAL:  Normal muscle strength and tone in all four extremities.  No  peripheral edema or cyanosis. SKIN: Skin turgor is normal. There are no pathologic skin lesions.  NEUROLOGIC:  Motor and sensation is grossly normal.  Cranial nerves are grossly intact. PSYCH:  Alert and oriented to person, place and time. Affect is normal.  Laboratory Analysis: Results for orders placed or performed during the hospital encounter of 08/03/18 (from the past 24 hour(s))  CBC with Differential     Status: Abnormal   Collection Time: 08/02/18 11:57 PM  Result Value Ref Range   WBC 10.7 (H) 4.0 - 10.5 K/uL   RBC 4.83 3.87 - 5.11 MIL/uL   Hemoglobin 13.9 12.0 - 15.0 g/dL   HCT 42.9 36.0 - 46.0 %   MCV 88.8 80.0 - 100.0 fL   MCH 28.8 26.0 - 34.0 pg   MCHC 32.4 30.0 - 36.0 g/dL   RDW 12.7 11.5 - 15.5 %   Platelets 387 150 - 400 K/uL   nRBC 0.0 0.0 - 0.2 %   Neutrophils Relative % 51 %   Neutro Abs 5.6 1.7 - 7.7 K/uL   Lymphocytes Relative 38 %   Lymphs Abs 4.1 (H) 0.7 - 4.0 K/uL   Monocytes Relative 7 %   Monocytes Absolute 0.7 0.1 - 1.0 K/uL   Eosinophils Relative 2 %   Eosinophils Absolute 0.2 0.0 - 0.5 K/uL   Basophils Relative 1 %   Basophils Absolute 0.1 0.0 - 0.1 K/uL   Immature Granulocytes 1 %   Abs Immature Granulocytes 0.05 0.00 - 0.07 K/uL  Comprehensive metabolic panel     Status: Abnormal   Collection Time: 08/02/18 11:57 PM  Result Value Ref Range   Sodium 138 135 - 145 mmol/L   Potassium 3.6 3.5 - 5.1 mmol/L   Chloride 107 98 - 111 mmol/L   CO2 26 22 - 32 mmol/L   Glucose, Bld 104 (H) 70 - 99 mg/dL   BUN 13 6 - 20 mg/dL   Creatinine, Ser 0.74 0.44 - 1.00 mg/dL   Calcium 8.8 (L) 8.9 - 10.3 mg/dL   Total Protein 6.4 (L) 6.5 - 8.1 g/dL   Albumin 3.6 3.5 - 5.0 g/dL   AST 13 (L) 15 - 41 U/L   ALT 13 0 - 44 U/L   Alkaline Phosphatase 55 38 - 126 U/L   Total Bilirubin 0.4 0.3 - 1.2 mg/dL   GFR calc non Af Amer >60 >60 mL/min   GFR calc Af Amer >60 >60 mL/min   Anion gap 5 5 - 15  Lipase, blood     Status: None   Collection Time: 08/02/18 11:57 PM   Result Value Ref Range   Lipase 28 11 - 51 U/L  Urinalysis, Complete w Microscopic     Status: Abnormal   Collection Time: 08/02/18 11:57 PM  Result Value Ref Range   Color, Urine YELLOW (A) YELLOW   APPearance CLOUDY (A) CLEAR   Specific Gravity, Urine 1.018 1.005 - 1.030   pH 6.0 5.0 - 8.0   Glucose, UA NEGATIVE NEGATIVE mg/dL   Hgb urine dipstick   NEGATIVE NEGATIVE   Bilirubin Urine NEGATIVE NEGATIVE   Ketones, ur NEGATIVE NEGATIVE mg/dL   Protein, ur NEGATIVE NEGATIVE mg/dL   Nitrite NEGATIVE NEGATIVE   Leukocytes, UA NEGATIVE NEGATIVE   RBC / HPF 0-5 0 - 5 RBC/hpf   WBC, UA 0-5 0 - 5 WBC/hpf   Bacteria, UA RARE (A) NONE SEEN   Squamous Epithelial / LPF 21-50 0 - 5   Mucus PRESENT   Troponin I - ONCE - STAT     Status: None   Collection Time: 08/02/18 11:57 PM  Result Value Ref Range   Troponin I <0.03 <0.03 ng/mL  Pregnancy, urine POC     Status: None   Collection Time: 08/02/18 11:59 PM  Result Value Ref Range   Preg Test, Ur NEGATIVE NEGATIVE    Imaging: Us Abdomen Limited Ruq  Result Date: 08/03/2018 CLINICAL DATA:  Initial evaluation for acute right upper quadrant pain. EXAM: ULTRASOUND ABDOMEN LIMITED RIGHT UPPER QUADRANT COMPARISON:  Prior CT from 06/28/2014. FINDINGS: Gallbladder: Multiple stones present within the gallbladder lumen, largest of which measures 1.5 mm. Small amount of associated sludge. A 1.2 cm stone appears lodged in the gallbladder neck. Gallbladder wall measures within normal limits at 2.3 mm. No free pericholecystic fluid. Positive sonographic Murphy sign elicited on exam. Common bile duct: Diameter: 2 mm Liver: No focal lesion identified. Within normal limits in parenchymal echogenicity. Portal vein is patent on color Doppler imaging with normal direction of blood flow towards the liver. IMPRESSION: 1. Cholelithiasis with 1.2 cm stone lodged within the gallbladder neck, with positive sonographic Murphy sign. Clinical correlation for possible acute  cholecystitis recommended. 2. No biliary dilatation. Electronically Signed   By: Benjamin  McClintock M.D.   On: 08/03/2018 01:51    Assessment and Plan: This is a 42 y.o. female with symptomatic cholelithiasis.  Discussed with the patient that the next step in management consists of surgery.  Discussed with her the role of laparoscopic cholecystectomy, post-op outcomes, restrictions, and recovery.  Discussed with her the risks of bleeding, infection, and injury to surrounding structures, including possibility of open procedure.  She understands this and is willing to proceed.  She smokes a pack of cigarettes per day and have advised her to quit smoking to help with wound healing after surgery.  Will plan on doing her surgery on 08/30/2018.  Patient understands that if any worsening pain again, she should present to the hospital ED for further evaluation and possible admission/surgery.  Face-to-face time spent with the patient and care providers was 45 minutes, with more than 50% of the time spent counseling, educating, and coordinating care of the patient.     Rashaun Wichert Luis Darean Rote, MD [facility]   

## 2018-08-03 NOTE — ED Notes (Signed)
Pt has right upper abd pain since noon yesterday.  Pt has nausea.  No vomiting.  Pain radiates into right mid back area.  States similar sx 2/19.  Hx of gallstones.   Pt alert

## 2018-08-23 ENCOUNTER — Encounter
Admission: RE | Admit: 2018-08-23 | Discharge: 2018-08-23 | Disposition: A | Discharge status: Home / Self Care | Payer: Medicaid Other | Source: Ambulatory Visit | Attending: Surgery | Admitting: Surgery

## 2018-08-23 ENCOUNTER — Other Ambulatory Visit: Payer: Self-pay

## 2018-08-23 HISTORY — DX: Other specified health status: Z78.9

## 2018-08-23 NOTE — Patient Instructions (Signed)
Your procedure is scheduled on: 08/30/17 Report to Day Surgery. [facility]OND [facility]LOOR To find out your arrival time please call [phone] between 1PM - 3PM on 08/27/17.  Remember: Instructions that are not followed completely may result in serious medical risk,  up to and including death, or upon the discretion of your surgeon and anesthesiologist your  surgery may need to be rescheduled.     _X__ 1. Do not eat food after midnight the night before your procedure.                 No gum chewing or hard candies. You may drink clear liquids up to 2 hours                 before you are scheduled to arrive for your surgery- DO not drink clear                 liquids within 2 hours of the start of your surgery.                 Clear Liquids include:  water, apple juice without pulp, clear carbohydrate                 drink such as Clearfast of Gatorade, Black Coffee or Tea (Do not add                 anything to coffee or tea).  __X__2.  On the morning of surgery brush your teeth with toothpaste and water, you                may rinse your mouth with mouthwash if you wish.  Do not swallow any toothpaste of mouthwash.     _X__ 3.  No Alcohol for 24 hours before or after surgery.   _X__ 4.  Do Not Smoke or use e-cigarettes For 24 Hours Prior to Your Surgery.                 Do not use any chewable tobacco products for at least 6 hours prior to                 surgery.  ____  5.  Bring all medications with you on the day of surgery if instructed.   __X__  6.  Notify your doctor if there is any change in your medical condition      (cold, fever, infections).     Do not wear jewelry, make-up, hairpins, clips or nail polish. Do not wear lotions, powders, or perfumes. You may wear deodorant. Do not shave 48 hours prior to surgery. Men may shave face and neck. Do not bring valuables to the hospital.    [facility] not [facility] for any belongings or  valuables.  Contacts, dentures or bridgework may not be worn into surgery. Leave your suitcase in the car. After surgery it may be brought to your room. For patients admitted to the hospital, discharge time is determined by your treatment team.   Patients discharged the day of surgery will not be allowed to drive home.     _X___ Take these medicines the morning of surgery with A SIP OF WATER:    1. ALPRAZOLAM  2.   3.   4.  5.  6.  ____ Fleet Enema (as directed)   ____ Use CHG Soap as directed  ____ Use inhalers on the day of surgery  ____ Stop metformin 2 days prior to surgery  ____ Take 1/2 of usual insulin dose the night before surgery. No insulin the morning          of surgery.   ____ Stop Coumadin/Plavix/aspirin on   ____ Stop Anti-inflammatories on   ____ Stop supplements until after surgery.    ____ Bring C-Pap to the hospital.

## 2018-08-30 ENCOUNTER — Encounter: Payer: Self-pay | Admitting: *Deleted

## 2018-08-30 ENCOUNTER — Ambulatory Visit
Admission: RE | Admit: 2018-08-30 | Discharge: 2018-08-30 | Disposition: A | Discharge status: Home / Self Care | Attending: Surgery | Admitting: Surgery

## 2018-08-30 ENCOUNTER — Ambulatory Visit: Admitting: Certified Registered"

## 2018-08-30 ENCOUNTER — Encounter
Admission: RE | Disposition: A | Discharge status: Home / Self Care | Payer: Self-pay | Source: Home / Self Care | Attending: Surgery

## 2018-08-30 ENCOUNTER — Other Ambulatory Visit: Payer: Self-pay

## 2018-08-30 DIAGNOSIS — K8010 Calculus of gallbladder with chronic cholecystitis without obstruction: Secondary | ICD-10-CM | POA: Insufficient documentation

## 2018-08-30 DIAGNOSIS — F419 Anxiety disorder, unspecified: Secondary | ICD-10-CM | POA: Insufficient documentation

## 2018-08-30 DIAGNOSIS — K8020 Calculus of gallbladder without cholecystitis without obstruction: Secondary | ICD-10-CM | POA: Diagnosis present

## 2018-08-30 DIAGNOSIS — F17210 Nicotine dependence, cigarettes, uncomplicated: Secondary | ICD-10-CM | POA: Insufficient documentation

## 2018-08-30 DIAGNOSIS — F329 Major depressive disorder, single episode, unspecified: Secondary | ICD-10-CM | POA: Diagnosis not present

## 2018-08-30 HISTORY — PX: CHOLECYSTECTOMY: SHX55

## 2018-08-30 LAB — POCT PREGNANCY, URINE: Preg Test, Ur: NEGATIVE

## 2018-08-30 SURGERY — LAPAROSCOPIC CHOLECYSTECTOMY
Anesthesia: General

## 2018-08-30 MED ORDER — ONDANSETRON HCL 4 MG/2ML IJ SOLN
INTRAMUSCULAR | Status: AC
  Filled 2018-08-30: qty 2

## 2018-08-30 MED ORDER — ONDANSETRON HCL 4 MG/2ML IJ SOLN
4.0000 mg | Freq: Once | INTRAMUSCULAR | Status: AC | PRN
  Administered 2018-08-30: 4 mg via INTRAVENOUS

## 2018-08-30 MED ORDER — IBUPROFEN 600 MG PO TABS: 600.0000 mg | ORAL_TABLET | Freq: Three times a day (TID) | ORAL | 0 refills | Status: DC | PRN

## 2018-08-30 MED ORDER — MIDAZOLAM HCL 2 MG/2ML IJ SOLN
INTRAMUSCULAR | Status: AC
  Filled 2018-08-30: qty 2

## 2018-08-30 MED ORDER — KETOROLAC TROMETHAMINE 30 MG/ML IJ SOLN
INTRAMUSCULAR | Status: AC
  Filled 2018-08-30: qty 1

## 2018-08-30 MED ORDER — GLYCOPYRROLATE 0.2 MG/ML IJ SOLN
INTRAMUSCULAR | Status: DC | PRN
  Administered 2018-08-30: .6 mg via INTRAVENOUS

## 2018-08-30 MED ORDER — FAMOTIDINE 20 MG PO TABS
20.0000 mg | ORAL_TABLET | Freq: Once | ORAL | Status: AC
  Administered 2018-08-30: 20 mg via ORAL

## 2018-08-30 MED ORDER — FENTANYL CITRATE (PF) 100 MCG/2ML IJ SOLN: 25.0000 ug | INTRAMUSCULAR | Status: DC | PRN

## 2018-08-30 MED ORDER — DEXAMETHASONE SODIUM PHOSPHATE 10 MG/ML IJ SOLN
INTRAMUSCULAR | Status: DC | PRN
  Administered 2018-08-30: 5 mg via INTRAVENOUS

## 2018-08-30 MED ORDER — NEOSTIGMINE METHYLSULFATE 10 MG/10ML IV SOLN
INTRAVENOUS | Status: AC
  Filled 2018-08-30: qty 1

## 2018-08-30 MED ORDER — ROCURONIUM BROMIDE 50 MG/5ML IV SOLN
INTRAVENOUS | Status: AC
  Filled 2018-08-30: qty 1

## 2018-08-30 MED ORDER — BUPIVACAINE-EPINEPHRINE (PF) 0.25% -1:200000 IJ SOLN
INTRAMUSCULAR | Status: DC | PRN
  Administered 2018-08-30: 30 mL

## 2018-08-30 MED ORDER — CEFAZOLIN SODIUM-DEXTROSE 2-4 GM/100ML-% IV SOLN
INTRAVENOUS | Status: AC
  Filled 2018-08-30: qty 100

## 2018-08-30 MED ORDER — FAMOTIDINE 20 MG PO TABS
ORAL_TABLET | ORAL | Status: AC
  Administered 2018-08-30: 20 mg
  Filled 2018-08-30: qty 1

## 2018-08-30 MED ORDER — NEOSTIGMINE METHYLSULFATE 10 MG/10ML IV SOLN
INTRAVENOUS | Status: DC | PRN
  Administered 2018-08-30: 3.5 mg via INTRAVENOUS

## 2018-08-30 MED ORDER — CHLORHEXIDINE GLUCONATE CLOTH 2 % EX PADS: 6.0000 | MEDICATED_PAD | Freq: Once | CUTANEOUS | Status: DC

## 2018-08-30 MED ORDER — ONDANSETRON HCL 4 MG/2ML IJ SOLN
INTRAMUSCULAR | Status: DC | PRN
  Administered 2018-08-30: 4 mg via INTRAVENOUS

## 2018-08-30 MED ORDER — BUPIVACAINE-EPINEPHRINE (PF) 0.25% -1:200000 IJ SOLN
INTRAMUSCULAR | Status: AC
  Filled 2018-08-30: qty 30

## 2018-08-30 MED ORDER — PROPOFOL 10 MG/ML IV BOLUS
INTRAVENOUS | Status: AC
  Filled 2018-08-30: qty 20

## 2018-08-30 MED ORDER — OXYCODONE HCL 5 MG PO TABS: 5.0000 mg | ORAL_TABLET | ORAL | 0 refills | Status: DC | PRN

## 2018-08-30 MED ORDER — LIDOCAINE HCL (PF) 2 % IJ SOLN
INTRAMUSCULAR | Status: AC
  Filled 2018-08-30: qty 10

## 2018-08-30 MED ORDER — GLYCOPYRROLATE 0.2 MG/ML IJ SOLN
INTRAMUSCULAR | Status: AC
  Filled 2018-08-30: qty 1

## 2018-08-30 MED ORDER — PROPOFOL 10 MG/ML IV BOLUS
INTRAVENOUS | Status: DC | PRN
  Administered 2018-08-30: 140 mg via INTRAVENOUS

## 2018-08-30 MED ORDER — ACETAMINOPHEN 500 MG PO TABS
ORAL_TABLET | ORAL | Status: AC
  Filled 2018-08-30: qty 2

## 2018-08-30 MED ORDER — FENTANYL CITRATE (PF) 100 MCG/2ML IJ SOLN
INTRAMUSCULAR | Status: DC | PRN
  Administered 2018-08-30 (×2): 50 ug via INTRAVENOUS
  Administered 2018-08-30: 25 ug via INTRAVENOUS
  Administered 2018-08-30: 50 ug via INTRAVENOUS

## 2018-08-30 MED ORDER — DEXAMETHASONE SODIUM PHOSPHATE 10 MG/ML IJ SOLN
INTRAMUSCULAR | Status: AC
  Filled 2018-08-30: qty 1

## 2018-08-30 MED ORDER — GABAPENTIN 300 MG PO CAPS
ORAL_CAPSULE | ORAL | Status: AC
  Filled 2018-08-30: qty 1

## 2018-08-30 MED ORDER — LIDOCAINE HCL (CARDIAC) PF 100 MG/5ML IV SOSY
PREFILLED_SYRINGE | INTRAVENOUS | Status: DC | PRN
  Administered 2018-08-30: 60 mg via INTRAVENOUS

## 2018-08-30 MED ORDER — GABAPENTIN 300 MG PO CAPS
300.0000 mg | ORAL_CAPSULE | ORAL | Status: AC
  Administered 2018-08-30: 300 mg via ORAL

## 2018-08-30 MED ORDER — MIDAZOLAM HCL 2 MG/2ML IJ SOLN
INTRAMUSCULAR | Status: DC | PRN
  Administered 2018-08-30: 2 mg via INTRAVENOUS

## 2018-08-30 MED ORDER — LACTATED RINGERS IV SOLN
INTRAVENOUS | Status: DC
  Administered 2018-08-30 (×3): via INTRAVENOUS

## 2018-08-30 MED ORDER — PHENYLEPHRINE HCL 10 MG/ML IJ SOLN
INTRAMUSCULAR | Status: AC
  Filled 2018-08-30: qty 1

## 2018-08-30 MED ORDER — ACETAMINOPHEN 500 MG PO TABS
1000.0000 mg | ORAL_TABLET | ORAL | Status: AC
  Administered 2018-08-30: 1000 mg via ORAL

## 2018-08-30 MED ORDER — FENTANYL CITRATE (PF) 250 MCG/5ML IJ SOLN
INTRAMUSCULAR | Status: AC
  Filled 2018-08-30: qty 5

## 2018-08-30 MED ORDER — CEFAZOLIN SODIUM-DEXTROSE 2-4 GM/100ML-% IV SOLN
2.0000 g | INTRAVENOUS | Status: AC
  Administered 2018-08-30: 2 g via INTRAVENOUS

## 2018-08-30 MED ORDER — ROCURONIUM BROMIDE 100 MG/10ML IV SOLN
INTRAVENOUS | Status: DC | PRN
  Administered 2018-08-30: 50 mg via INTRAVENOUS

## 2018-08-30 SURGICAL SUPPLY — 47 items
APPLIER CLIP 5 13 M/L LIGAMAX5 (MISCELLANEOUS) ×3
BLADE SURG 15 STRL LF DISP TIS (BLADE) ×1 IMPLANT
BLADE SURG 15 STRL SS (BLADE) ×2
CANISTER SUCT 1200ML W/VALVE (MISCELLANEOUS) ×3 IMPLANT
CATH CHOLANGI 4FR 420404F (CATHETERS) IMPLANT
CHLORAPREP W/TINT 26ML (MISCELLANEOUS) ×3 IMPLANT
CLIP APPLIE 5 13 M/L LIGAMAX5 (MISCELLANEOUS) ×1 IMPLANT
CONRAY 60ML FOR OR (MISCELLANEOUS) ×1 IMPLANT
COVER WAND RF STERILE (DRAPES) ×1 IMPLANT
DERMABOND ADVANCED (GAUZE/BANDAGES/DRESSINGS) ×2
DERMABOND ADVANCED .7 DNX12 (GAUZE/BANDAGES/DRESSINGS) ×1 IMPLANT
DRAPE C-ARM XRAY 36X54 (DRAPES) IMPLANT
ELECT REM PT RETURN 9FT ADLT (ELECTROSURGICAL) ×3
ELECTRODE REM PT RTRN 9FT ADLT (ELECTROSURGICAL) ×1 IMPLANT
FILTER LAP SMOKE EVAC STRL (MISCELLANEOUS) ×1 IMPLANT
GLOVE SURG SYN 7.0 (GLOVE) ×3 IMPLANT
GLOVE SURG SYN 7.0 PF PI (GLOVE) ×1 IMPLANT
GLOVE SURG SYN 7.5  E (GLOVE) ×2
GLOVE SURG SYN 7.5 E (GLOVE) ×1 IMPLANT
GLOVE SURG SYN 7.5 PF PI (GLOVE) ×1 IMPLANT
GOWN STRL REUS W/ TWL LRG LVL3 (GOWN DISPOSABLE) ×3 IMPLANT
GOWN STRL REUS W/TWL LRG LVL3 (GOWN DISPOSABLE) ×4
IRRIGATION STRYKERFLOW (MISCELLANEOUS) IMPLANT
IRRIGATOR STRYKERFLOW (MISCELLANEOUS)
IV CATH ANGIO 12GX3 LT BLUE (NEEDLE) ×3 IMPLANT
IV NS 1000ML (IV SOLUTION)
IV NS 1000ML BAXH (IV SOLUTION) IMPLANT
JACKSON PRATT 10 (INSTRUMENTS) IMPLANT
L-HOOK LAP DISP 36CM (ELECTROSURGICAL) ×3
LABEL OR SOLS (LABEL) ×3 IMPLANT
LHOOK LAP DISP 36CM (ELECTROSURGICAL) ×1 IMPLANT
NEEDLE HYPO 22GX1.5 SAFETY (NEEDLE) ×4 IMPLANT
PACK LAP CHOLECYSTECTOMY (MISCELLANEOUS) ×3 IMPLANT
PENCIL ELECTRO HAND CTR (MISCELLANEOUS) ×3 IMPLANT
PORT ACCESS TROCAR AIRSEAL 5 (TROCAR) ×2 IMPLANT
POUCH SPECIMEN RETRIEVAL 10MM (ENDOMECHANICALS) ×3 IMPLANT
SCISSORS METZENBAUM CVD 33 (INSTRUMENTS) ×3 IMPLANT
SET TRI-LUMEN FLTR TB AIRSEAL (TUBING) ×2 IMPLANT
SLEEVE ADV FIXATION 5X100MM (TROCAR) ×9 IMPLANT
SPONGE VERSALON 4X4 4PLY (MISCELLANEOUS) IMPLANT
SUT MNCRL 4-0 (SUTURE) ×2
SUT MNCRL 4-0 27XMFL (SUTURE) ×1
SUT VICRYL 0 AB UR-6 (SUTURE) ×3 IMPLANT
SUTURE MNCRL 4-0 27XMF (SUTURE) ×1 IMPLANT
TROCAR BALLN GELPORT 12X130M (ENDOMECHANICALS) ×3 IMPLANT
TROCAR Z-THREAD OPTICAL 5X100M (TROCAR) ×3 IMPLANT
TUBING INSUFFLATION (TUBING) ×1 IMPLANT

## 2018-08-30 NOTE — Op Note (Signed)
  Procedure Date:  08/30/2018  Pre-operative Diagnosis:  Symptomatic cholelithiasis  Post-operative Diagnosis: Symptomatic cholelithiasis  Procedure:  Laparoscopic cholecystectomy  Surgeon:  Howie Ill, MD  Assistant:  Hervey Ard, PA-S  Anesthesia:  General endotracheal  Estimated Blood Loss:  10 ml  Specimens:  gallbladder  Complications:  None  Indications for Procedure:  This is a 43 y.o. female who presents with abdominal pain and workup revealing symptomatic cholelithiasis.  The benefits, complications, treatment options, and expected outcomes were discussed with the patient. The risks of bleeding, infection, recurrence of symptoms, failure to resolve symptoms, bile duct damage, bile duct leak, retained common bile duct stone, bowel injury, and need for further procedures were all discussed with the patient and she was willing to proceed.  Description of Procedure: The patient was correctly identified in the preoperative area and brought into the operating room.  The patient was placed supine with VTE prophylaxis in place.  Appropriate time-outs were performed.  Anesthesia was induced and the patient was intubated.  Appropriate antibiotics were infused.  The abdomen was prepped and draped in a sterile fashion. An infraumbilical incision was made. A cutdown technique was used to enter the abdominal cavity without injury, and a Hasson trocar was inserted.  Pneumoperitoneum was obtained with appropriate opening pressures.  A 5-mm port was placed in the subxiphoid area and two 5-mm ports were placed in the right upper quadrant under direct visualization.  The gallbladder was identified.  The fundus was grasped and retracted cephalad.  Adhesions were lysed bluntly and with electrocautery. The infundibulum was grasped and retracted laterally, exposing the peritoneum overlying the gallbladder.  This was incised with electrocautery and extended on either side of the gallbladder.   The cystic duct and cystic artery were clearly identified and bluntly dissected.  Both were clipped twice proximally and once distally, cutting in between.  The gallbladder was taken from the gallbladder fossa in a retrograde fashion with electrocautery. The gallbladder was placed in an Endocatch bag. The liver bed was inspected and any bleeding was controlled with electrocautery. The right upper quadrant was then inspected again revealing intact clips, no bleeding, and no ductal injury.  The 5 mm ports were removed under direct visualization and the Hasson trocar was removed.  The Endocatch bag was brought out via the umbilical incision. The fascial opening was closed using 0 vicryl suture.  Local anesthetic was infused in all incisions and the incisions were closed with 4-0 Monocryl.  The wounds were cleaned and sealed with DermaBond.  The patient was emerged from anesthesia and extubated and brought to the recovery room for further management.  The patient tolerated the procedure well and all counts were correct at the end of the case.   Howie Ill, MD

## 2018-08-30 NOTE — OR Nursing (Signed)
Nausea resolved.  No vomiting.  Pain is tolerable before discharge home.  States I'll take ibuprofen at home.

## 2018-08-30 NOTE — Anesthesia Post-op Follow-up Note (Signed)
Anesthesia QCDR form completed.        

## 2018-08-30 NOTE — Interval H&P Note (Signed)
History and Physical Interval Note:  08/30/2018 8:11 AM  Teresa Wise  has presented today for surgery, with the diagnosis of sympatomatic cholelithiasis  The various methods of treatment have been discussed with the patient and family. After consideration of risks, benefits and other options for treatment, the patient has consented to  Procedure(s): LAPAROSCOPIC CHOLECYSTECTOMY (N/A) as a surgical intervention .  The patient's history has been reviewed, patient examined, no change in status, stable for surgery.  I have reviewed the patient's chart and labs.  Questions were answered to the patient's satisfaction.     Kenyia Wambolt

## 2018-08-30 NOTE — Discharge Instructions (Signed)
Laparoscopic Cholecystectomy, Care After °This sheet gives you information about how to care for yourself after your procedure. Your doctor may also give you more specific instructions. If you have problems or questions, contact your doctor. °Follow these instructions at home: °Care for cuts from surgery (incisions) ° °· Follow instructions from your doctor about how to take care of your cuts from surgery. Make sure you: °? Wash your hands with soap and water before you change your bandage (dressing). If you cannot use soap and water, use hand sanitizer. °? Change your bandage as told by your doctor. °? Leave stitches (sutures), skin glue, or skin tape (adhesive) strips in place. They may need to stay in place for 2 weeks or longer. If tape strips get loose and curl up, you may trim the loose edges. Do not remove tape strips completely unless your doctor says it is okay. °· Do not take baths, swim, or use a hot tub until your doctor says it is okay. Ask your doctor if you can take showers. You may only be allowed to take sponge baths for bathing. °· Check your surgical cut area every day for signs of infection. Check for: °? More redness, swelling, or pain. °? More fluid or blood. °? Warmth. °? Pus or a bad smell. °Activity °· Do not drive or use heavy machinery while taking prescription pain medicine. °· Do not lift anything that is heavier than 10 lb (4.5 kg) until your doctor says it is okay. °· Do not play contact sports until your doctor says it is okay. °· Do not drive for 24 hours if you were given a medicine to help you relax (sedative). °· Rest as needed. Do not return to work or school until your doctor says it is okay. °General instructions °· Take over-the-counter and prescription medicines only as told by your doctor. °· To prevent or treat constipation while you are taking prescription pain medicine, your doctor may recommend that you: °? Drink enough fluid to keep your pee (urine) clear or pale  yellow. °? Take over-the-counter or prescription medicines. °? Eat foods that are high in fiber, such as fresh fruits and vegetables, whole grains, and beans. °? Limit foods that are high in fat and processed sugars, such as fried and sweet foods. °Contact a doctor if: °· You develop a rash. °· You have more redness, swelling, or pain around your surgical cuts. °· You have more fluid or blood coming from your surgical cuts. °· Your surgical cuts feel warm to the touch. °· You have pus or a bad smell coming from your surgical cuts. °· You have a fever. °· One or more of your surgical cuts breaks open. °Get help right away if: °· You have trouble breathing. °· You have chest pain. °· You have pain that is getting worse in your shoulders. °· You faint or feel dizzy when you stand. °· You have very bad pain in your belly (abdomen). °· You are sick to your stomach (nauseous) for more than one day. °· You have throwing up (vomiting) that lasts for more than one day. °· You have leg pain. °This information is not intended to replace advice given to you by your health care provider. Make sure you discuss any questions you have with your health care provider. °Document Released: 05/20/2008 Document Revised: 03/01/2016 Document Reviewed: 01/28/2016 °Elsevier Interactive Patient Education © 2019 [employer] ° °AMBULATORY SURGERY  °DISCHARGE INSTRUCTIONS ° ° °1) The drugs that you were given   will stay in your system until tomorrow so for the next 24 hours you should not: ° °A) Drive an automobile °B) Make any legal decisions °C) Drink any alcoholic beverage ° ° °2) You may resume regular meals tomorrow.  Today it is better to start with liquids and gradually work up to solid foods. ° °You may eat anything you prefer, but it is better to start with liquids, then soup and crackers, and gradually work up to solid foods. ° ° °3) Please notify your doctor immediately if you have any unusual bleeding, trouble breathing, redness and  pain at the surgery site, drainage, fever, or pain not relieved by medication. ° ° ° °4) Additional Instructions: ° ° ° ° ° ° ° °Please contact your physician with any problems or Same Day Surgery at [phone], Monday through Friday 6 am to 4 pm, or [facility] Main number at [phone]. ° °

## 2018-08-30 NOTE — Transfer of Care (Signed)
Immediate Anesthesia Transfer of Care Note  Patient: Teresa Wise  Procedure(s) Performed: LAPAROSCOPIC CHOLECYSTECTOMY (N/A )  Patient Location: PACU  Anesthesia Type:General  Level of Consciousness: awake and drowsy  Airway & Oxygen Therapy: Patient Spontanous Breathing and Patient connected to face mask oxygen  Post-op Assessment: Report given to RN  Post vital signs: Reviewed and stable  Last Vitals:  Vitals Value Taken Time  BP 94/62 08/30/2018 10:07 AM  Temp    Pulse 89 08/30/2018 10:08 AM  Resp 15 08/30/2018 10:08 AM  SpO2 100 % 08/30/2018 10:08 AM  Vitals shown include unvalidated device data.  Last Pain:  Vitals:   08/30/18 0722  TempSrc: Temporal  PainSc: 0-No pain         Complications: No apparent anesthesia complications

## 2018-08-30 NOTE — Anesthesia Procedure Notes (Signed)
Procedure Name: Intubation Date/Time: 08/30/2018 8:34 AM Performed by: Fredderick Phenix, CRNA Pre-anesthesia Checklist: Patient identified, Emergency Drugs available, Suction available, Patient being monitored and Timeout performed Patient Re-evaluated:Patient Re-evaluated prior to induction Oxygen Delivery Method: Circle system utilized Preoxygenation: Pre-oxygenation with 100% oxygen Induction Type: IV induction Ventilation: Mask ventilation without difficulty Laryngoscope Size: 3 and Mac Grade View: Grade I Tube type: Oral Tube size: 7.0 mm Number of attempts: 1 Airway Equipment and Method: Stylet Placement Confirmation: ETT inserted through vocal cords under direct vision,  positive ETCO2,  CO2 detector and breath sounds checked- equal and bilateral Secured at: 20 cm Tube secured with: Tape

## 2018-08-30 NOTE — Anesthesia Preprocedure Evaluation (Signed)
Anesthesia Evaluation  Patient identified by MRN, date of birth, ID band Patient awake    Reviewed: Allergy & Precautions, H&P , NPO status , Patient's Chart, lab work & pertinent test results, reviewed documented beta blocker date and time   Airway Mallampati: II  TM Distance: >3 FB Neck ROM: full    Dental  (+) Teeth Intact   Pulmonary neg pulmonary ROS, Current Smoker,    Pulmonary exam normal        Cardiovascular Exercise Tolerance: Good negative cardio ROS Normal cardiovascular exam Rhythm:regular Rate:Normal     Neuro/Psych PSYCHIATRIC DISORDERS Anxiety Depression negative neurological ROS     GI/Hepatic negative GI ROS, Neg liver ROS,   Endo/Other  negative endocrine ROS  Renal/GU negative Renal ROS  negative genitourinary   Musculoskeletal   Abdominal   Peds  Hematology negative hematology ROS (+)   Anesthesia Other Findings Past Medical History: No date: Anxiety No date: Depressed No date: History of shingles No date: Medical history non-contributory No date: Ovarian cyst Past Surgical History: No date: APPENDECTOMY No date: TUBAL LIGATION; Bilateral No date: TUBAL LIGATION BMI    Body Mass Index:  28.17 kg/m     Reproductive/Obstetrics negative OB ROS                             Anesthesia Physical Anesthesia Plan  ASA: II  Anesthesia Plan: General ETT   Post-op Pain Management:    Induction:   PONV Risk Score and Plan:   Airway Management Planned:   Additional Equipment:   Intra-op Plan:   Post-operative Plan:   Informed Consent: I have reviewed the patients History and Physical, chart, labs and discussed the procedure including the risks, benefits and alternatives for the proposed anesthesia with the patient or authorized representative who has indicated his/her understanding and acceptance.   Dental Advisory Given  Plan Discussed with:  CRNA  Anesthesia Plan Comments:         Anesthesia Quick Evaluation

## 2018-08-31 NOTE — Anesthesia Postprocedure Evaluation (Signed)
Anesthesia Post Note  Patient: Teresa Wise  Procedure(s) Performed: LAPAROSCOPIC CHOLECYSTECTOMY (N/A )  Patient location during evaluation: PACU Anesthesia Type: General Level of consciousness: awake and alert Pain management: pain level controlled Vital Signs Assessment: post-procedure vital signs reviewed and stable Respiratory status: spontaneous breathing, nonlabored ventilation, respiratory function stable and patient connected to nasal cannula oxygen Cardiovascular status: blood pressure returned to baseline and stable Postop Assessment: no apparent nausea or vomiting Anesthetic complications: no     Last Vitals:  Vitals:   08/30/18 1052 08/30/18 1146  BP: 106/74 116/78  Pulse: 63 97  Resp:    Temp: (!) 36 C   SpO2: 100% 97%    Last Pain:  Vitals:   08/30/18 1146  TempSrc:   PainSc: 4                  Yevette Edwards

## 2018-09-01 ENCOUNTER — Encounter: Payer: Self-pay | Admitting: Surgery

## 2018-09-01 LAB — SURGICAL PATHOLOGY

## 2018-09-15 ENCOUNTER — Other Ambulatory Visit: Payer: Self-pay

## 2018-09-15 ENCOUNTER — Ambulatory Visit (INDEPENDENT_AMBULATORY_CARE_PROVIDER_SITE_OTHER): Admitting: Surgery

## 2018-09-15 ENCOUNTER — Encounter: Payer: Self-pay | Admitting: Surgery

## 2018-09-15 VITALS — BP 127/76 | HR 116 | Temp 97.5°F | Resp 16 | Ht 60.0 in | Wt 154.6 lb

## 2018-09-15 DIAGNOSIS — Z09 Encounter for follow-up examination after completed treatment for conditions other than malignant neoplasm: Secondary | ICD-10-CM

## 2018-09-15 DIAGNOSIS — K8020 Calculus of gallbladder without cholecystitis without obstruction: Secondary | ICD-10-CM

## 2018-09-15 NOTE — Progress Notes (Signed)
09/15/2018  HPI: Teresa Wise is a 43 y.o. female s/p laparoscopic cholecystectomy on 08/30/18.  She presents for follow up.  She reports she is doing well.  She did notice a little bit of bloody drainage from the umbilical incision 1 day after mid turning weird but otherwise has not had any issues with the incisions.  Has had good appetite and bowel movements and denies any pain at this point.  She did develop a rash with the Dermabond at each of the incisions.  Vital signs: BP 127/76   Pulse (!) 116   Temp (!) 97.5 F (36.4 C) (Temporal)   Resp 16   Ht 5' (1.524 m)   Wt 154 lb 9.6 oz (70.1 kg)   SpO2 96%   BMI 30.19 kg/m    Physical Exam: Constitutional: No acute distress Abdomen: Soft, nondistended, nontender to palpation.  All incisions are clean dry and intact and healing well.  No evidence of infection.  Assessment/Plan: This is a 43 y.o. female s/p laparoscopic cholecystectomy.  -Discussed with the patient that we will add Dermabond to the list of her allergies as a precaution. - Patient is healing well and currently there are no complications. - Patient still has 2 more weeks of no heavy lifting or pushing nothing more than 10 to 15 pounds.  She may start ramping up activity at that point slowly and at 6 weeks he may liberalize all her activities. -Patient may follow-up prn.   Howie Ill, MD [facility]

## 2018-09-15 NOTE — Patient Instructions (Signed)
GENERAL POST-OPERATIVE PATIENT INSTRUCTIONS   WOUND CARE INSTRUCTIONS:  Keep a dry clean dressing on the wound if there is drainage. The initial bandage may be removed after 24 hours.  Once the wound has quit draining you may leave it open to air.  If clothing rubs against the wound or causes irritation and the wound is not draining you may cover it with a dry dressing during the daytime.  Try to keep the wound dry and avoid ointments on the wound unless directed to do so.  If the wound becomes bright red and painful or starts to drain infected material that is not clear, please contact your physician immediately.  If the wound is mildly pink and has a thick firm ridge underneath it, this is normal, and is referred to as a healing ridge.  This will resolve over the next 4-6 weeks.  BATHING: You may shower if you have been informed of this by your surgeon. However, Please do not submerge in a tub, hot tub, or pool until incisions are completely sealed or have been told by your surgeon that you may do so.  DIET:  You may eat any foods that you can tolerate.  It is a good idea to eat a high fiber diet and take in plenty of fluids to prevent constipation.  If you do become constipated you may want to take a mild laxative or take ducolax tablets on a daily basis until your bowel habits are regular.  Constipation can be very uncomfortable, along with straining, after recent surgery.  ACTIVITY:  You are encouraged to cough and deep breath or use your incentive spirometer if you were given one, every 15-30 minutes when awake.  This will help prevent respiratory complications and low grade fevers post-operatively if you had a general anesthetic.  You may want to hug a pillow when coughing and sneezing to add additional support to the surgical area, if you had abdominal or chest surgery, which will decrease pain during these times.  You are encouraged to walk and engage in light activity for the next two weeks.  You  should not lift more than 20 pounds, until 10/11/2018 as it could put you at increased risk for complications.  Twenty pounds is roughly equivalent to a plastic bag of groceries. At that time- Listen to your body when lifting, if you have pain when lifting, stop and then try again in a few days. Soreness after doing exercises or activities of daily living is normal as you get back in to your normal routine.  MEDICATIONS:  Try to take narcotic medications and anti-inflammatory medications, such as tylenol, ibuprofen, naprosyn, etc., with food.  This will minimize stomach upset from the medication.  Should you develop nausea and vomiting from the pain medication, or develop a rash, please discontinue the medication and contact your physician.  You should not drive, make important decisions, or operate machinery when taking narcotic pain medication.  SUNBLOCK Use sun block to incision area over the next year if this area will be exposed to sun. This helps decrease scarring and will allow you avoid a permanent darkened area over your incision.  QUESTIONS:  Please feel free to call our office if you have any questions, and we will be glad to assist you. [phone]    

## 2018-10-07 ENCOUNTER — Emergency Department
Admission: EM | Admit: 2018-10-07 | Discharge: 2018-10-07 | Disposition: A | Discharge status: Hospice | Attending: Emergency Medicine | Admitting: Emergency Medicine

## 2018-10-07 ENCOUNTER — Other Ambulatory Visit: Payer: Self-pay

## 2018-10-07 ENCOUNTER — Encounter: Payer: Self-pay | Admitting: Intensive Care

## 2018-10-07 DIAGNOSIS — M545 Low back pain: Secondary | ICD-10-CM | POA: Diagnosis present

## 2018-10-07 DIAGNOSIS — M5431 Sciatica, right side: Secondary | ICD-10-CM

## 2018-10-07 DIAGNOSIS — Z79899 Other long term (current) drug therapy: Secondary | ICD-10-CM | POA: Insufficient documentation

## 2018-10-07 DIAGNOSIS — M5441 Lumbago with sciatica, right side: Secondary | ICD-10-CM | POA: Insufficient documentation

## 2018-10-07 DIAGNOSIS — F17210 Nicotine dependence, cigarettes, uncomplicated: Secondary | ICD-10-CM | POA: Insufficient documentation

## 2018-10-07 MED ORDER — HYDROCODONE-ACETAMINOPHEN 5-325 MG PO TABS: 1.0000 | ORAL_TABLET | Freq: Three times a day (TID) | ORAL | 0 refills | Status: AC | PRN

## 2018-10-07 MED ORDER — KETOROLAC TROMETHAMINE 30 MG/ML IJ SOLN
30.0000 mg | Freq: Once | INTRAMUSCULAR | Status: AC
  Administered 2018-10-07: 30 mg via INTRAMUSCULAR
  Filled 2018-10-07: qty 1

## 2018-10-07 MED ORDER — CYCLOBENZAPRINE HCL 5 MG PO TABS: 5.0000 mg | ORAL_TABLET | Freq: Three times a day (TID) | ORAL | 0 refills | Status: DC | PRN

## 2018-10-07 MED ORDER — ORPHENADRINE CITRATE 30 MG/ML IJ SOLN
60.0000 mg | INTRAMUSCULAR | Status: AC
  Administered 2018-10-07: 60 mg via INTRAMUSCULAR
  Filled 2018-10-07: qty 2

## 2018-10-07 MED ORDER — KETOROLAC TROMETHAMINE 10 MG PO TABS: 10.0000 mg | ORAL_TABLET | Freq: Three times a day (TID) | ORAL | 0 refills | Status: DC

## 2018-10-07 NOTE — ED Triage Notes (Signed)
Patient c/o lower back pain that started a few days ago. HX chronic back problems. Denies recent injury. Does not see pain clinic

## 2018-10-07 NOTE — ED Provider Notes (Signed)
[facility] Emergency Department Provider Note ____________________________________________  Time seen: 1139  I have reviewed the triage vital signs and the nursing notes.  HISTORY  Chief Complaint  Back Pain  HPI Teresa Wise is a 43 y.o. female presents to the ED for evaluation of right low back pain with lower extremity referral.  Patient with a history of sciatica reports typical symptoms on the left side.  Denies any recent injury, accident, trauma.  She denies any bladder or bowel incontinence, foot drop, or saddle anesthesia.  She has been taking Tylenol and Motrin over the last few days without significant benefit.  Past Medical History:  Diagnosis Date  . Anxiety   . Depressed   . History of shingles   . Medical history non-contributory   . Ovarian cyst     Patient Active Problem List   Diagnosis Date Noted  . Symptomatic cholelithiasis 08/03/2018  . 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 Suicidal ideation 04/22/2016    Past Surgical History:  Procedure Laterality Date  . APPENDECTOMY    . CHOLECYSTECTOMY N/A 08/30/2018   Procedure: LAPAROSCOPIC CHOLECYSTECTOMY;  Surgeon: Henrene Dodge, MD;  Location: [facility] ORS;  Service: General;  Laterality: N/A;  . TUBAL LIGATION Bilateral   . TUBAL LIGATION      Prior to Admission medications   Medication Sig Start Date End Date Taking? Authorizing Provider  ALPRAZolam Prudy Feeler) 1 MG tablet Take 1 mg by mouth 4 (four) times daily.    [provider]  cyclobenzaprine (FLEXERIL) 5 MG tablet Take 1 tablet (5 mg total) by mouth 3 (three) times daily as needed for muscle spasms. 10/07/18   Mason Dibiasio, Charlesetta Ivory, PA-C  HYDROcodone-acetaminophen (NORCO) 5-325 MG tablet Take 1 tablet by mouth 3 (three) times daily as needed for up to 2 days. 10/07/18 10/09/18  Brysen Shankman, Charlesetta Ivory, PA-C  ibuprofen (ADVIL,MOTRIN) 600 MG tablet Take 1 tablet (600 mg total) by mouth  every 8 (eight) hours as needed for fever or mild pain. 08/30/18   Henrene Dodge, MD  ketorolac (TORADOL) 10 MG tablet Take 1 tablet (10 mg total) by mouth every 8 (eight) hours. 10/07/18   Omega Slager, Charlesetta Ivory, PA-C    Allergies Adhesive [tape]  History reviewed. No pertinent family history.  Social History Social History   Tobacco Use  . Smoking status: Current Every Day Smoker    Packs/day: 1.00    Types: Cigarettes  . Smokeless tobacco: Never Used  Substance Use Topics  . Alcohol use: No  . Drug use: No    Review of Systems  Constitutional: Negative for fever. Cardiovascular: Negative for chest pain. Respiratory: Negative for shortness of breath. Gastrointestinal: Negative for abdominal pain, vomiting and diarrhea. Genitourinary: Negative for dysuria. Musculoskeletal: Positive for back pain with right lower extremity referral.. Skin: Negative for rash. Neurological: Negative for headaches, focal weakness or numbness. ____________________________________________  PHYSICAL EXAM:  VITAL SIGNS: ED Triage Vitals [10/07/18 1044]  Enc Vitals Group     BP 92/64     Pulse Rate (!) 104     Resp 16     Temp 98.4 F (36.9 C)     Temp Source Oral     SpO2 97 %     Weight 155 lb (70.3 kg)     Height 5' (1.524 m)     Head Circumference      Peak Flow      Pain Score 7     Pain  Loc      Pain Edu?      Excl. in GC?     Constitutional: Alert and oriented. Well appearing and in no distress. Head: Normocephalic and atraumatic. Eyes: Conjunctivae are normal.  Normal extraocular movements Cardiovascular: Normal rate, regular rhythm. Normal distal pulses. Respiratory: Normal respiratory effort. No wheezes/rales/rhonchi. Gastrointestinal: Soft and nontender. No distention. Musculoskeletal: Normal spinal alignment without midline tenderness, spasm, deformity, or step-off.  Patient transitions from sit to stand without assistance.  Nontender with normal range of motion in all  extremities.  Neurologic: Cranial nerves II through XII grossly intact.  Normal LE DTRs bilaterally.  Normal gait without ataxia. Normal speech and language. No gross focal neurologic deficits are appreciated. Skin:  Skin is warm, dry and intact. No rash noted. ____________________________________________  PROCEDURES  Procedures Toradol 30 mg IM Norflex 60 mg IM ____________________________________________  INITIAL IMPRESSION / ASSESSMENT AND PLAN / ED COURSE  Patient with ED evaluation of posterior right leg pain similar to sciatica she is experienced on the left.  Patient reports improvement of her symptoms after IM medication administration.  Patient is inclined to take medications prescribed for pain, spasm, and inflammation, respectively.  She will be discharged with a small prescription for hydrocodone (#6), cyclobenzaprine, and ketorolac.  She will follow with primary provider or return to the ED as needed.  I reviewed the patient's prescription history over the last 12 months in the multi-state controlled substances database(s) that includes [place], [place], [place], [place], [place], [place], [place], [place], [place], [place], [place], [place], [place], and [place].  Results were notable for no current prescriptions.  ____________________________________________  FINAL CLINICAL IMPRESSION(S) / ED DIAGNOSES  Final diagnoses:  Sciatica of right side      Lissa Hoard, PA-C 10/07/18 1917    Minna Antis, MD 10/11/18 1955

## 2018-10-07 NOTE — ED Notes (Signed)
Acute on chronic back pain. Denies injury. Able to bear weight. Denies changes in bowel or bladder.

## 2018-10-07 NOTE — Discharge Instructions (Signed)
Your exam is consistent with sciatic nerve irritation. Take the prescription meds as directed. Follow-up with Dr. Yetta Barre for ongoing symptoms.

## 2019-08-23 ENCOUNTER — Emergency Department
Admission: EM | Admit: 2019-08-23 | Discharge: 2019-08-23 | Disposition: A | Discharge status: Home / Self Care | Attending: Emergency Medicine | Admitting: Emergency Medicine

## 2019-08-23 ENCOUNTER — Other Ambulatory Visit: Payer: Self-pay

## 2019-08-23 DIAGNOSIS — Z79899 Other long term (current) drug therapy: Secondary | ICD-10-CM | POA: Insufficient documentation

## 2019-08-23 DIAGNOSIS — F17210 Nicotine dependence, cigarettes, uncomplicated: Secondary | ICD-10-CM | POA: Insufficient documentation

## 2019-08-23 DIAGNOSIS — F329 Major depressive disorder, single episode, unspecified: Secondary | ICD-10-CM | POA: Insufficient documentation

## 2019-08-23 DIAGNOSIS — Z760 Encounter for issue of repeat prescription: Secondary | ICD-10-CM

## 2019-08-23 DIAGNOSIS — F419 Anxiety disorder, unspecified: Secondary | ICD-10-CM | POA: Insufficient documentation

## 2019-08-23 MED ORDER — ALPRAZOLAM 1 MG PO TABS: 1.0000 mg | ORAL_TABLET | Freq: Three times a day (TID) | ORAL | 0 refills | Status: DC | PRN

## 2019-08-23 MED ORDER — ALPRAZOLAM 0.5 MG PO TABS
1.0000 mg | ORAL_TABLET | Freq: Once | ORAL | Status: AC
  Administered 2019-08-23: 04:00:00 1 mg via ORAL
  Filled 2019-08-23: qty 4

## 2019-08-23 NOTE — ED Triage Notes (Signed)
Patient out of alprazolam, reports that MD did not get in called into pharmacy on time. Patient on 1 mg, tid. Patient reports last dose on Sunday at 0730.

## 2019-08-23 NOTE — ED Provider Notes (Signed)
[facility] Emergency Department Provider Note  ____________________________________________   First MD Initiated Contact with Patient 08/23/19 [phone]     (approximate)  I have reviewed the triage vital signs and the nursing notes.   HISTORY  Chief Complaint Medication Refill    HPI Teresa Wise is a 43 y.o. female with below list of previous medical conditions including depression and anxiety for which patient takes Xanax 1 mg 3 times daily presents to the emergency department secondary to "running out of Xanax with her last dose taken on Sunday at 730.  Patient states that Dr. Jamse Arn did not call in her prescription when it was due.  Patient admits to feelings of anxiety and abdominal cramping.  Patient denies any suicidal ideation.        Past Medical History:  Diagnosis Date  . Anxiety   . Depressed   . History of shingles   . Medical history non-contributory   . Ovarian cyst     Patient Active Problem List   Diagnosis Date Noted  . Symptomatic cholelithiasis 08/03/2018  . Severe recurrent major depression without psychotic features ([place]) 04/22/2016  . Suicidal ideation 04/22/2016    Past Surgical History:  Procedure Laterality Date  . APPENDECTOMY    . CHOLECYSTECTOMY N/A 08/30/2018   Procedure: LAPAROSCOPIC CHOLECYSTECTOMY;  Surgeon: Olean Ree, MD;  Location: [facility] ORS;  Service: General;  Laterality: N/A;  . TUBAL LIGATION Bilateral   . TUBAL LIGATION      Prior to Admission medications   Medication Sig Start Date End Date Taking? Authorizing Provider  ALPRAZolam Duanne Moron) 1 MG tablet Take 1 mg by mouth 4 (four) times daily.    [provider]  cyclobenzaprine (FLEXERIL) 5 MG tablet Take 1 tablet (5 mg total) by mouth 3 (three) times daily as needed for muscle spasms. 10/07/18   Menshew, Dannielle Karvonen, PA-C  ibuprofen (ADVIL,MOTRIN) 600 MG tablet Take 1 tablet (600 mg total) by mouth every 8 (eight) hours as needed for fever or  mild pain. 08/30/18   Olean Ree, MD  ketorolac (TORADOL) 10 MG tablet Take 1 tablet (10 mg total) by mouth every 8 (eight) hours. 10/07/18   Menshew, Dannielle Karvonen, PA-C    Allergies Adhesive [tape]  No family history on file.  Social History Social History   Tobacco Use  . Smoking status: Current Every Day Smoker    Packs/day: 1.00    Types: Cigarettes  . Smokeless tobacco: Never Used  Substance Use Topics  . Alcohol use: No  . Drug use: No    Review of Systems Constitutional: No fever/chills Eyes: No visual changes. ENT: No sore throat. Cardiovascular: Denies chest pain. Respiratory: Denies shortness of breath. Gastrointestinal: No abdominal pain.  No nausea, no vomiting.  No diarrhea.  No constipation. Genitourinary: Negative for dysuria. Musculoskeletal: Negative for neck pain.  Negative for back pain. Integumentary: Negative for rash. Neurological: Negative for headaches, focal weakness or numbness. Psychiatric:  Positive for anxiety   ____________________________________________   PHYSICAL EXAM:  VITAL SIGNS: ED Triage Vitals  Enc Vitals Group     BP 08/23/19 0010 117/74     Pulse Rate 08/23/19 0010 93     Resp 08/23/19 0016 17     Temp 08/23/19 0010 98.1 F (36.7 C)     Temp Source 08/23/19 0010 Oral     SpO2 08/23/19 0010 98 %     Weight --      Height --  Head Circumference --      Peak Flow --      Pain Score 08/23/19 0014 6     Pain Loc --      Pain Edu? --      Excl. in GC? --     Constitutional: Alert and oriented.  Eyes: Conjunctivae are normal.  Mouth/Throat: Patient is wearing a mask. Neck: No stridor.  No meningeal signs.   Cardiovascular: Normal rate, regular rhythm. Good peripheral circulation. Grossly normal heart sounds. Respiratory: Normal respiratory effort.  No retractions. Gastrointestinal: Soft and nontender. No distention.  Musculoskeletal: No lower extremity tenderness nor edema. No gross deformities of  extremities. Neurologic:  Normal speech and language. No gross focal neurologic deficits are appreciated.  Skin:  Skin is warm, dry and intact. Psychiatric: Anxious affect. Speech and behavior are normal.   Procedures   ____________________________________________   INITIAL IMPRESSION / MDM / ASSESSMENT AND PLAN / ED COURSE  As part of my medical decision making, I reviewed the following data within the electronic MEDICAL RECORD NUMBER   44 year old female presented with above-stated history and physical exam secondary to anxiety and withdrawal from Xanax.  Patient given 1 mg of Xanax p.o. in the emergency department will be prescribed 6 tablets for home.  I reviewed the patient's narcotic database information which revealed that her last prescription for her 1 month supply was indeed filled on 07/20/2019.  ____________________________________________  FINAL CLINICAL IMPRESSION(S) / ED DIAGNOSES  Final diagnoses:  Medication refill     MEDICATIONS GIVEN DURING THIS VISIT:  Medications  ALPRAZolam (XANAX) tablet 1 mg (has no administration in time range)     ED Discharge Orders    None      *Please note:  Teresa Wise was evaluated in Emergency Department on 08/23/2019 for the symptoms described in the history of present illness. She was evaluated in the context of the global COVID-19 pandemic, which necessitated consideration that the patient might be at risk for infection with the SARS-CoV-2 virus that causes COVID-19. Institutional protocols and algorithms that pertain to the evaluation of patients at risk for COVID-19 are in a state of rapid change based on information released by regulatory bodies including the CDC and federal and state organizations. These policies and algorithms were followed during the patient's care in the ED.  Some ED evaluations and interventions may be delayed as a result of limited staffing during the pandemic.*  Note:  This document was prepared using  Dragon voice recognition software and may include unintentional dictation errors.   Darci Current, MD 08/23/19 [phone]

## 2019-08-23 NOTE — ED Notes (Signed)
Pt ambulatory to triage without difficulty or distress noted; Dr Owens Shark to triage to evaluate pt

## 2019-12-31 ENCOUNTER — Encounter: Payer: Self-pay | Admitting: Emergency Medicine

## 2019-12-31 ENCOUNTER — Other Ambulatory Visit: Payer: Self-pay

## 2019-12-31 ENCOUNTER — Emergency Department
Admission: EM | Admit: 2019-12-31 | Discharge: 2019-12-31 | Disposition: A | Discharge status: Home / Self Care | Payer: Medicaid Other | Attending: Emergency Medicine | Admitting: Emergency Medicine

## 2019-12-31 DIAGNOSIS — F13930 Sedative, hypnotic or anxiolytic use, unspecified with withdrawal, uncomplicated: Secondary | ICD-10-CM

## 2019-12-31 DIAGNOSIS — Z79899 Other long term (current) drug therapy: Secondary | ICD-10-CM | POA: Insufficient documentation

## 2019-12-31 DIAGNOSIS — F13230 Sedative, hypnotic or anxiolytic dependence with withdrawal, uncomplicated: Secondary | ICD-10-CM | POA: Insufficient documentation

## 2019-12-31 DIAGNOSIS — F17210 Nicotine dependence, cigarettes, uncomplicated: Secondary | ICD-10-CM | POA: Insufficient documentation

## 2019-12-31 LAB — COMPREHENSIVE METABOLIC PANEL
ALT: 20 U/L (ref 0–44)
AST: 19 U/L (ref 15–41)
Albumin: 3.8 g/dL (ref 3.5–5.0)
Alkaline Phosphatase: 53 U/L (ref 38–126)
Anion gap: 8 (ref 5–15)
BUN: 11 mg/dL (ref 6–20)
CO2: 24 mmol/L (ref 22–32)
Calcium: 8.9 mg/dL (ref 8.9–10.3)
Chloride: 107 mmol/L (ref 98–111)
Creatinine, Ser: 0.8 mg/dL (ref 0.44–1.00)
GFR calc Af Amer: >60 mL/min (ref >60)
GFR calc non Af Amer: >60 mL/min (ref >60)
Glucose, Bld: 100 mg/dL — ABNORMAL HIGH (ref 70–99)
Potassium: 3.9 mmol/L (ref 3.5–5.1)
Sodium: 139 mmol/L (ref 135–145)
Total Bilirubin: 0.4 mg/dL (ref 0.3–1.2)
Total Protein: 7.1 g/dL (ref 6.5–8.1)

## 2019-12-31 LAB — CBC
HCT: 43.4 % (ref 36.0–46.0)
Hemoglobin: 14.4 g/dL (ref 12.0–15.0)
MCH: 29.3 pg (ref 26.0–34.0)
MCHC: 33.2 g/dL (ref 30.0–36.0)
MCV: 88.2 fL (ref 80.0–100.0)
Platelets: 395 10*3/uL (ref 150–400)
RBC: 4.92 MIL/uL (ref 3.87–5.11)
RDW: 13.4 % (ref 11.5–15.5)
WBC: 11.6 10*3/uL — ABNORMAL HIGH (ref 4.0–10.5)
nRBC: 0 % (ref 0.0–0.2)

## 2019-12-31 LAB — SALICYLATE LEVEL: Salicylate Lvl: <7 mg/dL — ABNORMAL LOW (ref 7.0–30.0)

## 2019-12-31 LAB — ACETAMINOPHEN LEVEL: Acetaminophen (Tylenol), Serum: <10 ug/mL — ABNORMAL LOW (ref 10–30)

## 2019-12-31 LAB — ETHANOL: Alcohol, Ethyl (B): <10 mg/dL (ref <10)

## 2019-12-31 MED ORDER — ONDANSETRON HCL 4 MG PO TABS
4.0000 mg | ORAL_TABLET | Freq: Once | ORAL | Status: AC
  Administered 2019-12-31: 10:00:00 4 mg via ORAL
  Filled 2019-12-31: qty 1

## 2019-12-31 MED ORDER — ALPRAZOLAM 0.5 MG PO TABS
0.5000 mg | ORAL_TABLET | ORAL | Status: AC
  Administered 2019-12-31: 0.5 mg via ORAL
  Filled 2019-12-31: qty 1

## 2019-12-31 MED ORDER — ALPRAZOLAM 0.5 MG PO TABS: 0.5000 mg | ORAL_TABLET | Freq: Every day | ORAL | 0 refills | Status: AC

## 2019-12-31 NOTE — ED Triage Notes (Signed)
Pt presents to ED via POV with c/o anxiety and panic attacks since Tuesday. Pt presents to ED A&O x4. Calm and cooperative. Pt states hx of same. Pt states takes medications for same, but the medications aren't working. Pt denies SI/HI at this time.

## 2019-12-31 NOTE — ED Notes (Signed)
First Nurse Note: Pt ambulatory into ED c/o panic attack. No distress noted at this time.

## 2019-12-31 NOTE — Discharge Instructions (Signed)
Please add alprazolam 0.5 mg by mouth each afternoon (in addition to your current regimen).  Follow-up with Dr. Georjean Mode Monday.  No driving today or while taking alprazolam.

## 2019-12-31 NOTE — ED Provider Notes (Signed)
[facility] Emergency Department Provider Note   ____________________________________________   First MD Initiated Contact with Patient 12/31/19 [phone]     (approximate)  I have reviewed the triage vital signs and the nursing notes.   HISTORY  Chief Complaint Anxiety   HPI Kiani Wurtzel is a 44 y.o. female here for evaluation for drug withdrawal  Patient reports she takes Xanax was prescribed her, he recently decreased her dose.  Since doing so she had increased anxiety, now developing shakiness and feeling of frequent "panic"   No hallucinations.  Reports similar symptoms with previous changes.  Been on Xanax for several years, decision to taper was made with her doctor.  No thoughts of self-harm or wanting to harm others  No recent illness.  No fevers or chills.  Feeling very "shaky".  And improves when she takes her evening dose of Xanax, but does not seem to get relief with the low-dose 0.5 mg she is taking in the morning  Currently taking 0.5 mg each morning and 1 mg each night.  Was previously taking 1 mg up to 4 times daily when she started to come off this medicine about 2 weeks ago  Past Medical History:  Diagnosis Date  . Anxiety   . Depressed   . History of shingles   . Medical history non-contributory   . Ovarian cyst     Patient Active Problem List   Diagnosis Date Noted  . Symptomatic cholelithiasis 08/03/2018  . 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 Suicidal ideation 04/22/2016    Past Surgical History:  Procedure Laterality Date  . APPENDECTOMY    . CHOLECYSTECTOMY N/A 08/30/2018   Procedure: LAPAROSCOPIC CHOLECYSTECTOMY;  Surgeon: Henrene Dodge, MD;  Location: [facility] ORS;  Service: General;  Laterality: N/A;  . TUBAL LIGATION Bilateral   . TUBAL LIGATION      Prior to Admission medications   Medication Sig Start Date End Date Taking? Authorizing Provider  ALPRAZolam Prudy Feeler) 0.5 MG  tablet Take 1 tablet (0.5 mg total) by mouth daily in the afternoon. 12/31/19 12/30/20  Sharyn Creamer, MD  ALPRAZolam Prudy Feeler) 1 MG tablet Take 1 mg by mouth 4 (four) times daily.    [provider]  ALPRAZolam Prudy Feeler) 1 MG tablet Take 1 tablet (1 mg total) by mouth 3 (three) times daily as needed for up to 6 doses for anxiety. 08/23/19   Darci Current, MD  cyclobenzaprine (FLEXERIL) 5 MG tablet Take 1 tablet (5 mg total) by mouth 3 (three) times daily as needed for muscle spasms. 10/07/18   Menshew, Charlesetta Ivory, PA-C  ibuprofen (ADVIL,MOTRIN) 600 MG tablet Take 1 tablet (600 mg total) by mouth every 8 (eight) hours as needed for fever or mild pain. 08/30/18   Henrene Dodge, MD  ketorolac (TORADOL) 10 MG tablet Take 1 tablet (10 mg total) by mouth every 8 (eight) hours. 10/07/18   Menshew, Charlesetta Ivory, PA-C    Allergies Adhesive [tape]  No family history on file.  Social History Social History   Tobacco Use  . Smoking status: Current Every Day Smoker    Packs/day: 1.00    Types: Cigarettes  . Smokeless tobacco: Never Used  Substance Use Topics  . Alcohol use: No  . Drug use: No    Review of Systems Constitutional: No fever/chills Eyes: No visual changes. ENT: No sore throat. Cardiovascular: Denies chest pain. Respiratory: Denies shortness of breath. Gastrointestinal: No abdominal pain.   Genitourinary:  Negative for dysuria. Musculoskeletal: Negative for back pain. Skin: Negative for rash. Neurological: Negative for headaches, areas of focal weakness or numbness.  Still has prescription for Xanax, but taking decreased dose.  She is contacted her doctor's office already on Thursday but they have not gotten back to her and so they can take up to 72 hours.  Sees Dr. Georjean Mode  ____________________________________________   PHYSICAL EXAM:  VITAL SIGNS: ED Triage Vitals [12/31/19 0838]  Enc Vitals Group     BP (!) 128/96     Pulse Rate (!) 124     Resp 20     Temp 98.1  F (36.7 C)     Temp Source Oral     SpO2 98 %     Weight 160 lb (72.6 kg)     Height 5' (1.524 m)     Head Circumference      Peak Flow      Pain Score 0     Pain Loc      Pain Edu?      Excl. in GC?     Constitutional: Alert and oriented. Well appearing and in no acute distress does appear somewhat anxious though. Eyes: Conjunctivae are normal. Head: Atraumatic. Nose: No congestion/rhinnorhea. Mouth/Throat: Mucous membranes are moist. Neck: No stridor.  Cardiovascular: Slightly tachycardic rate, regular rhythm. Grossly normal heart sounds.  Good peripheral circulation. Respiratory: Normal respiratory effort.  No retractions. Lungs CTAB. Gastrointestinal: Soft and nontender. No distention. Musculoskeletal: No lower extremity tenderness nor edema. Neurologic:  Normal speech and language. No gross focal neurologic deficits are appreciated.  Mild tremulousness in upper extremities bilateral. Skin:  Skin is warm, dry and intact. No rash noted. Psychiatric: Mood and affect are normal to slightly anxious. Speech and behavior are normal.  ____________________________________________   LABS (all labs ordered are listed, but only abnormal results are displayed)  Labs Reviewed  COMPREHENSIVE METABOLIC PANEL - Abnormal; Notable for the following components:      Result Value   Glucose, Bld 100 (*)    All other components within normal limits  SALICYLATE LEVEL - Abnormal; Notable for the following components:   Salicylate Lvl <7.0 (*)    All other components within normal limits  ACETAMINOPHEN LEVEL - Abnormal; Notable for the following components:   Acetaminophen (Tylenol), Serum <10 (*)    All other components within normal limits  CBC - Abnormal; Notable for the following components:   WBC 11.6 (*)    All other components within normal limits  ETHANOL  URINE DRUG SCREEN, QUALITATIVE ([facility] ONLY)  POC URINE PREG, ED    ____________________________________________  EKG   ____________________________________________  RADIOLOGY   ____________________________________________   PROCEDURES  Procedure(s) performed: None  Procedures  Critical Care performed: No  ____________________________________________   INITIAL IMPRESSION / ASSESSMENT AND PLAN / ED COURSE  Pertinent labs & imaging results that were available during my care of the patient were reviewed by me and considered in my medical decision making (see chart for details).   Patient presents for concerns of anxiety, possible withdrawal symptoms with a recent Xanax taper.  She is tachycardic and tremulous on arrival  We will give additional 0.5 mg Xanax here.  Plan to reassess.  She denies any associated acute medical illness or acute psychiatric symptoms suggest complicated withdrawal.  ----------------------------------------- 11:06 AM on 12/31/2019 -----------------------------------------  Vital signs have normalized and she feels much better.  She appears comfortable resting comfortably.  Discussed with her and will provide additional Xanax that  she can use in the afternoon for the next 5 days until she can get in contact with Dr. Jamse Arn regarding her taper and plan for tapering and symptoms developed.  Return precautions and treatment recommendations and follow-up discussed with the patient who is agreeable with the plan.  She is not driving.    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

## 2020-01-03 ENCOUNTER — Telehealth: Payer: Self-pay

## 2020-01-03 NOTE — Telephone Encounter (Signed)
Spoke to [facility] concerning pt wanting to be seen today for anxiety. I explained that pt has not been seen since 2017- she will need to be seen by PCP, which appears to be Dr Larwance Sachs, or go back to ER. She was there on 5/8 for withdrawals. [facility] will notify pt and I have removed Dr Yetta Barre' name from PCP info

## 2021-01-16 ENCOUNTER — Other Ambulatory Visit: Payer: Self-pay

## 2021-01-16 ENCOUNTER — Emergency Department
Admission: EM | Admit: 2021-01-16 | Discharge: 2021-01-16 | Disposition: A | Discharge status: Home / Self Care | Payer: Self-pay | Attending: Emergency Medicine | Admitting: Emergency Medicine

## 2021-01-16 ENCOUNTER — Emergency Department: Payer: Self-pay

## 2021-01-16 DIAGNOSIS — F17210 Nicotine dependence, cigarettes, uncomplicated: Secondary | ICD-10-CM | POA: Insufficient documentation

## 2021-01-16 DIAGNOSIS — R1031 Right lower quadrant pain: Secondary | ICD-10-CM

## 2021-01-16 LAB — CBC WITH DIFFERENTIAL/PLATELET
Abs Immature Granulocytes: 0.04 10*3/uL (ref 0.00–0.07)
Basophils Absolute: 0.1 10*3/uL (ref 0.0–0.1)
Basophils Relative: 1 %
Eosinophils Absolute: 0.2 10*3/uL (ref 0.0–0.5)
Eosinophils Relative: 2 %
HCT: 41.9 % (ref 36.0–46.0)
Hemoglobin: 13.6 g/dL (ref 12.0–15.0)
Immature Granulocytes: 0 %
Lymphocytes Relative: 31 %
Lymphs Abs: 3.4 10*3/uL (ref 0.7–4.0)
MCH: 28.9 pg (ref 26.0–34.0)
MCHC: 32.5 g/dL (ref 30.0–36.0)
MCV: 89 fL (ref 80.0–100.0)
Monocytes Absolute: 0.6 10*3/uL (ref 0.1–1.0)
Monocytes Relative: 6 %
Neutro Abs: 6.7 10*3/uL (ref 1.7–7.7)
Neutrophils Relative %: 60 %
Platelets: 386 10*3/uL (ref 150–400)
RBC: 4.71 MIL/uL (ref 3.87–5.11)
RDW: 13.9 % (ref 11.5–15.5)
WBC: 11.1 10*3/uL — ABNORMAL HIGH (ref 4.0–10.5)
nRBC: 0 % (ref 0.0–0.2)

## 2021-01-16 LAB — BASIC METABOLIC PANEL
Anion gap: 7 (ref 5–15)
BUN: 12 mg/dL (ref 6–20)
CO2: 25 mmol/L (ref 22–32)
Calcium: 8.4 mg/dL — ABNORMAL LOW (ref 8.9–10.3)
Chloride: 108 mmol/L (ref 98–111)
Creatinine, Ser: 0.79 mg/dL (ref 0.44–1.00)
GFR, Estimated: >60 mL/min (ref >60)
Glucose, Bld: 132 mg/dL — ABNORMAL HIGH (ref 70–99)
Potassium: 3.4 mmol/L — ABNORMAL LOW (ref 3.5–5.1)
Sodium: 140 mmol/L (ref 135–145)

## 2021-01-16 LAB — HEPATIC FUNCTION PANEL
ALT: 19 U/L (ref 0–44)
AST: 16 U/L (ref 15–41)
Albumin: 3.5 g/dL (ref 3.5–5.0)
Alkaline Phosphatase: 44 U/L (ref 38–126)
Bilirubin, Direct: <0.1 mg/dL (ref 0.0–0.2)
Total Bilirubin: 0.5 mg/dL (ref 0.3–1.2)
Total Protein: 6.5 g/dL (ref 6.5–8.1)

## 2021-01-16 LAB — LIPASE, BLOOD: Lipase: 37 U/L (ref 11–51)

## 2021-01-16 LAB — URINALYSIS, ROUTINE W REFLEX MICROSCOPIC
Bacteria, UA: NONE SEEN
Bilirubin Urine: NEGATIVE
Glucose, UA: NEGATIVE mg/dL
Ketones, ur: NEGATIVE mg/dL
Leukocytes,Ua: NEGATIVE
Nitrite: POSITIVE — AB
Protein, ur: 30 mg/dL — AB
Specific Gravity, Urine: 1.025 (ref 1.005–1.030)
WBC, UA: NONE SEEN WBC/hpf (ref 0–5)
pH: 5 (ref 5.0–8.0)

## 2021-01-16 LAB — POC URINE PREG, ED: Preg Test, Ur: NEGATIVE

## 2021-01-16 MED ORDER — ONDANSETRON 4 MG PO TBDP: 4.0000 mg | ORAL_TABLET | Freq: Three times a day (TID) | ORAL | 0 refills | Status: DC | PRN

## 2021-01-16 MED ORDER — ONDANSETRON HCL 4 MG/2ML IJ SOLN
4.0000 mg | Freq: Once | INTRAMUSCULAR | Status: AC
  Administered 2021-01-16: 4 mg via INTRAVENOUS
  Filled 2021-01-16: qty 2

## 2021-01-16 MED ORDER — KETOROLAC TROMETHAMINE 30 MG/ML IJ SOLN
15.0000 mg | Freq: Once | INTRAMUSCULAR | Status: AC
  Administered 2021-01-16: 15 mg via INTRAVENOUS
  Filled 2021-01-16: qty 1

## 2021-01-16 MED ORDER — HYDROMORPHONE HCL 1 MG/ML IJ SOLN
0.5000 mg | Freq: Once | INTRAMUSCULAR | Status: AC
  Administered 2021-01-16: 0.5 mg via INTRAVENOUS
  Filled 2021-01-16: qty 1

## 2021-01-16 MED ORDER — IBUPROFEN 600 MG PO TABS: 600.0000 mg | ORAL_TABLET | Freq: Three times a day (TID) | ORAL | 0 refills | Status: DC | PRN

## 2021-01-16 NOTE — Discharge Instructions (Addendum)
I would recommend seeing an OB in the next several weeks to discuss medication options and further work-up.  For now, take the ibuprofen and Zofran as needed for pain and nausea, respectively.  Return to the ER as needed.

## 2021-01-16 NOTE — ED Provider Notes (Signed)
[facility] Emergency Department Provider Note  ____________________________________________   Event Date/Time   First MD Initiated Contact with Patient 01/16/21 [phone]     (approximate)  I have reviewed the triage vital signs and the nursing notes.   HISTORY  Chief Complaint Ovarian Cyst    HPI Teresa Wise is a 45 y.o. female with history of ovarian cyst who comes in with right lower quadrant pain.  Patient reports intermittent right lower quadrant pain since yesterday that is gotten more consistent, severe, constant, nothing makes better, nothing makes it worse.  Denies any vaginal discharge or concerns for STDs.  Reports having surgery on this ovary before for a dermoid cyst.  No chest pain, shortness of breath, urinary symptoms.  Patient reports that she is already had her gallbladder and appendix removed            Past Medical History:  Diagnosis Date  . Anxiety   . Depressed   . History of shingles   . Medical history non-contributory   . Ovarian cyst     Patient Active Problem List   Diagnosis Date Noted  . Symptomatic cholelithiasis 08/03/2018  . 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 Suicidal ideation 04/22/2016    Past Surgical History:  Procedure Laterality Date  . APPENDECTOMY    . CHOLECYSTECTOMY N/A 08/30/2018   Procedure: LAPAROSCOPIC CHOLECYSTECTOMY;  Surgeon: Henrene Dodge, MD;  Location: [facility] ORS;  Service: General;  Laterality: N/A;  . TUBAL LIGATION Bilateral   . TUBAL LIGATION      Prior to Admission medications   Medication Sig Start Date End Date Taking? Authorizing Provider  ALPRAZolam Prudy Feeler) 1 MG tablet Take 1 mg by mouth 4 (four) times daily.    [provider]  ALPRAZolam Prudy Feeler) 1 MG tablet Take 1 tablet (1 mg total) by mouth 3 (three) times daily as needed for up to 6 doses for anxiety. 08/23/19   Darci Current, MD  cyclobenzaprine (FLEXERIL) 5 MG  tablet Take 1 tablet (5 mg total) by mouth 3 (three) times daily as needed for muscle spasms. 10/07/18   Menshew, Charlesetta Ivory, PA-C  ibuprofen (ADVIL,MOTRIN) 600 MG tablet Take 1 tablet (600 mg total) by mouth every 8 (eight) hours as needed for fever or mild pain. 08/30/18   Henrene Dodge, MD  ketorolac (TORADOL) 10 MG tablet Take 1 tablet (10 mg total) by mouth every 8 (eight) hours. 10/07/18   Menshew, Charlesetta Ivory, PA-C    Allergies Adhesive [tape]  No family history on file.  Social History Social History   Tobacco Use  . Smoking status: Current Every Day Smoker    Packs/day: 1.00    Types: Cigarettes  . Smokeless tobacco: Never Used  Vaping Use  . Vaping Use: Never used  Substance Use Topics  . Alcohol use: No  . Drug use: No      Review of Systems Constitutional: No fever/chills Eyes: No visual changes. ENT: No sore throat. Cardiovascular: Denies chest pain. Respiratory: Denies shortness of breath. Gastrointestinal: Positive abdominal pain no nausea, no vomiting.  No diarrhea.  No constipation. Genitourinary: Negative for dysuria. Musculoskeletal: Negative for back pain. Skin: Negative for rash. Neurological: Negative for headaches, focal weakness or numbness. All other ROS negative ____________________________________________   PHYSICAL EXAM:  VITAL SIGNS: ED Triage Vitals  Enc Vitals Group     BP 01/16/21 0359 133/73     Pulse Rate 01/16/21 0359 (!) 103  Resp 01/16/21 0359 18     Temp 01/16/21 0359 97.7 F (36.5 C)     Temp src --      SpO2 01/16/21 0359 98 %     Weight 01/16/21 0358 180 lb (81.6 kg)     Height 01/16/21 0358 5' (1.524 m)     Head Circumference --      Peak Flow --      Pain Score 01/16/21 0358 8     Pain Loc --      Pain Edu? --      Excl. in GC? --     Constitutional: Alert and oriented. Well appearing and in no acute distress. Eyes: Conjunctivae are normal. EOMI. Head: Atraumatic. Nose: No  congestion/rhinnorhea. Mouth/Throat: Mucous membranes are moist.   Neck: No stridor. Trachea Midline. FROM Cardiovascular: Normal rate, regular rhythm. Grossly normal heart sounds.  Good peripheral circulation. Respiratory: Normal respiratory effort.  No retractions. Lungs CTAB. Gastrointestinal: Soft with slight tenderness in the right lower no distention. No abdominal bruits.  Musculoskeletal: No lower extremity tenderness nor edema.  No joint effusions. Neurologic:  Normal speech and language. No gross focal neurologic deficits are appreciated.  Skin:  Skin is warm, dry and intact. No rash noted. Psychiatric: Mood and affect are normal. Speech and behavior are normal. GU: Deferred   ____________________________________________   LABS (all labs ordered are listed, but only abnormal results are displayed)  Labs Reviewed  CBC WITH DIFFERENTIAL/PLATELET - Abnormal; Notable for the following components:      Result Value   WBC 11.1 (*)    All other components within normal limits  BASIC METABOLIC PANEL - Abnormal; Notable for the following components:   Potassium 3.4 (*)    Glucose, Bld 132 (*)    Calcium 8.4 (*)    All other components within normal limits  URINALYSIS, ROUTINE W REFLEX MICROSCOPIC - Abnormal; Notable for the following components:   Color, Urine YELLOW (*)    APPearance CLOUDY (*)    Hgb urine dipstick LARGE (*)    Protein, ur 30 (*)    Nitrite POSITIVE (*)    All other components within normal limits  POC URINE PREG, ED   ____________________________________________  RADIOLOGY   Official radiology report(s): US PELVIC COMPLETE W TRANSVAGINAL AND TORSION R/O  Result Date: 01/16/2021 CLINICAL DATA:  Right lower quadrant pain since yesterday. Last menstrual period 01/06/2021. EXAM: TRANSABDOMINAL AND TRANSVAGINAL ULTRASOUND OF PELVIS DOPPLER ULTRASOUND OF OVARIES TECHNIQUE: Both transabdominal and transvaginal ultrasound examinations of the pelvis were  performed. Transabdominal technique was performed for global imaging of the pelvis including uterus, ovaries, adnexal regions, and pelvic cul-de-sac. It was necessary to proceed with endovaginal exam following the transabdominal exam to visualize the endometrium and bilateral ovaries. Color and duplex Doppler ultrasound was utilized to evaluate blood flow to the ovaries. COMPARISON:  CT abdomen pelvis 02/04/2015. FINDINGS: Other findings No abnormal free fluid. Measurements: 10.2 x 25.3 x 5.6 cm = volume: 157 mL. No fibroids or other mass visualized. Endometrium Thickness: 12 mm.  No focal abnormality visualized. Right ovary Measurements: 3.1 x 1.1 x 1.9 cm = volume: 3.5 mL. Normal appearance/no adnexal mass. Please note the right ovary is only visualized transabdominally. Left ovary Measurements: 4.5 x 2.2 x 2.3 cm = volume: 11.7 mL. Two simple cysts are noted with the largest measuring 2.4 cm. Normal appearance/no adnexal mass.Please note the left ovary is only visualized transabdominally. Pulsed Doppler evaluation of both ovaries demonstrates normal low-resistance arterial and venous  waveforms. Other: Trace simple free fluid within the pelvis. IMPRESSION: Unremarkable pelvic ultrasound with limited evaluation of the bilateral ovaries that are only visualized on transabdominal view. Electronically Signed   By: Tish Frederickson M.D.   On: 01/16/2021 06:55    ____________________________________________   PROCEDURES  Procedure(s) performed (including Critical Care):  Procedures   ____________________________________________   INITIAL IMPRESSION / ASSESSMENT AND PLAN / ED COURSE  Edla Para was evaluated in Emergency Department on 01/16/2021 for the symptoms described in the history of present illness. She was evaluated in the context of the global COVID-19 pandemic, which necessitated consideration that the patient might be at risk for infection with the SARS-CoV-2 virus that causes COVID-19.  Institutional protocols and algorithms that pertain to the evaluation of patients at risk for COVID-19 are in a state of rapid change based on information released by regulatory bodies including the CDC and federal and state organizations. These policies and algorithms were followed during the patient's care in the ED.    Patient comes in with right lower quadrant pain with no appendix therefore unlikely appendicitis.  Most likely related to the ovary therefore will get ultrasound to evaluate for torsion, cyst rupture.  Denies any symptoms to suggest PID.  We will give 1 dose of IV Dilaudid and Zofran while awaiting imaging  Patient's urine is positive for hemoglobin but patient states that she started having some vaginal bleeding. Positive for nitrites.  We will send for culture but pain does not seem to be related to this.  Ultrasound did not show any evidence of issues with her ovaries.  Patient reports continued pain.  We will give another dose of IV Dilaudid and get CT to make sure no evidence of kidney stones, perforation, bowel obstruction or other acute pathology.  Patient got off to oncoming team pending these results         ____________________________________________   FINAL CLINICAL IMPRESSION(S) / ED DIAGNOSES   Final diagnoses:  RLQ abdominal pain      MEDICATIONS GIVEN DURING THIS VISIT:  Medications  HYDROmorphone (DILAUDID) injection 0.5 mg (has no administration in time range)  HYDROmorphone (DILAUDID) injection 0.5 mg (0.5 mg Intravenous Given 01/16/21 0554)  ondansetron (ZOFRAN) injection 4 mg (4 mg Intravenous Given 01/16/21 0555)     ED Discharge Orders    None       Note:  This document was prepared using Dragon voice recognition software and may include unintentional dictation errors.   Concha Se, MD 01/16/21 [phone]

## 2021-01-16 NOTE — ED Notes (Signed)
Patient transported to ultrasound.

## 2021-01-16 NOTE — ED Triage Notes (Addendum)
Pt states that she has been having LRQ abd pain since yesterday, pt states that she has a known ovarian cyst on that side. Pt states she is unsure if she is having any abnormal bleeding because she just finished her period yesterday. Pt denies urinary symptoms, n/v/d.

## 2021-01-18 LAB — URINE CULTURE: Culture: >=100000 — AB

## 2021-01-19 NOTE — Progress Notes (Signed)
ED Antimicrobial Stewardship Positive Culture Follow Up   Teresa Wise is an 45 y.o. female who presented to [facility] on 01/16/2021 with a chief complaint of  Chief Complaint  Patient presents with  . Ovarian Cyst    Recent Results (from the past 720 hour(s))  Urine culture     Status: Abnormal   Collection Time: 01/16/21  4:00 AM   Specimen: Urine, Random  Result Value Ref Range Status   Specimen Description   Final    URINE, RANDOM Performed at [facility], [address] [place], [place] [address]    Special Requests   Final    NONE Performed at [facility], [address] [place], [place] [address]    Culture >=100,000 COLONIES/mL ESCHERICHIA COLI (A)  Final   Report Status 01/18/2021 FINAL  Final   Organism ID, Bacteria ESCHERICHIA COLI (A)  Final      Susceptibility   Escherichia coli - MIC*    AMPICILLIN <=2 SENSITIVE Sensitive     CEFAZOLIN <=4 SENSITIVE Sensitive     CEFEPIME <=0.12 SENSITIVE Sensitive     CEFTRIAXONE <=0.25 SENSITIVE Sensitive     CIPROFLOXACIN <=0.25 SENSITIVE Sensitive     GENTAMICIN <=1 SENSITIVE Sensitive     IMIPENEM <=0.25 SENSITIVE Sensitive     NITROFURANTOIN <=16 SENSITIVE Sensitive     TRIMETH/SULFA <=20 SENSITIVE Sensitive     AMPICILLIN/SULBACTAM <=2 SENSITIVE Sensitive     PIP/TAZO <=4 SENSITIVE Sensitive     * >=100,000 COLONIES/mL ESCHERICHIA COLI     []  Patient discharged originally without antimicrobial agent and treatment is now indicated per MD  New antibiotic prescription: Cephalexin 500mg  PO TID x 5 days  ED Provider: C.Jessup  5/28 1622 Called patient at [phone] to determine preferred pharmacy. Called in Rx to [facility] [phone]. Discussed to monitor for reactions and/or if no improvement  to f/u with PCP or contact [facility] ED.    [name] A 01/19/2021, 4:21 PM Clinical Pharmacist

## 2021-02-16 ENCOUNTER — Encounter: Payer: Self-pay | Admitting: Emergency Medicine

## 2021-02-16 ENCOUNTER — Emergency Department: Payer: Self-pay

## 2021-02-16 ENCOUNTER — Emergency Department
Admission: EM | Admit: 2021-02-16 | Discharge: 2021-02-16 | Disposition: A | Discharge status: Home / Self Care | Payer: Self-pay | Attending: Emergency Medicine | Admitting: Emergency Medicine

## 2021-02-16 ENCOUNTER — Other Ambulatory Visit: Payer: Self-pay

## 2021-02-16 DIAGNOSIS — R059 Cough, unspecified: Secondary | ICD-10-CM | POA: Insufficient documentation

## 2021-02-16 DIAGNOSIS — H9209 Otalgia, unspecified ear: Secondary | ICD-10-CM | POA: Insufficient documentation

## 2021-02-16 DIAGNOSIS — R531 Weakness: Secondary | ICD-10-CM | POA: Insufficient documentation

## 2021-02-16 DIAGNOSIS — R0602 Shortness of breath: Secondary | ICD-10-CM | POA: Insufficient documentation

## 2021-02-16 DIAGNOSIS — F17210 Nicotine dependence, cigarettes, uncomplicated: Secondary | ICD-10-CM | POA: Insufficient documentation

## 2021-02-16 DIAGNOSIS — M546 Pain in thoracic spine: Secondary | ICD-10-CM | POA: Insufficient documentation

## 2021-02-16 DIAGNOSIS — R000 Tachycardia, unspecified: Secondary | ICD-10-CM | POA: Insufficient documentation

## 2021-02-16 DIAGNOSIS — R0981 Nasal congestion: Secondary | ICD-10-CM | POA: Insufficient documentation

## 2021-02-16 DIAGNOSIS — R519 Headache, unspecified: Secondary | ICD-10-CM | POA: Insufficient documentation

## 2021-02-16 DIAGNOSIS — J029 Acute pharyngitis, unspecified: Secondary | ICD-10-CM | POA: Insufficient documentation

## 2021-02-16 DIAGNOSIS — Z20822 Contact with and (suspected) exposure to covid-19: Secondary | ICD-10-CM | POA: Insufficient documentation

## 2021-02-16 LAB — RESP PANEL BY RT-PCR (FLU A&B, COVID) ARPGX2
Influenza A by PCR: NEGATIVE
Influenza B by PCR: NEGATIVE
SARS Coronavirus 2 by RT PCR: NEGATIVE

## 2021-02-16 LAB — CBC
HCT: 41.7 % (ref 36.0–46.0)
Hemoglobin: 14 g/dL (ref 12.0–15.0)
MCH: 29.1 pg (ref 26.0–34.0)
MCHC: 33.6 g/dL (ref 30.0–36.0)
MCV: 86.7 fL (ref 80.0–100.0)
Platelets: 416 10*3/uL — ABNORMAL HIGH (ref 150–400)
RBC: 4.81 MIL/uL (ref 3.87–5.11)
RDW: 14.1 % (ref 11.5–15.5)
WBC: 10.2 10*3/uL (ref 4.0–10.5)
nRBC: 0 % (ref 0.0–0.2)

## 2021-02-16 LAB — BASIC METABOLIC PANEL
Anion gap: 6 (ref 5–15)
BUN: 11 mg/dL (ref 6–20)
CO2: 25 mmol/L (ref 22–32)
Calcium: 8.6 mg/dL — ABNORMAL LOW (ref 8.9–10.3)
Chloride: 108 mmol/L (ref 98–111)
Creatinine, Ser: 0.78 mg/dL (ref 0.44–1.00)
GFR, Estimated: >60 mL/min (ref >60)
Glucose, Bld: 109 mg/dL — ABNORMAL HIGH (ref 70–99)
Potassium: 3.6 mmol/L (ref 3.5–5.1)
Sodium: 139 mmol/L (ref 135–145)

## 2021-02-16 LAB — TROPONIN I (HIGH SENSITIVITY)
Troponin I (High Sensitivity): 4 ng/L (ref <18)
Troponin I (High Sensitivity): 3 ng/L (ref <18)

## 2021-02-16 LAB — POC URINE PREG, ED: Preg Test, Ur: NEGATIVE

## 2021-02-16 MED ORDER — SODIUM CHLORIDE 0.9 % IV BOLUS
500.0000 mL | Freq: Once | INTRAVENOUS | Status: AC
  Administered 2021-02-16: 500 mL via INTRAVENOUS

## 2021-02-16 MED ORDER — IOHEXOL 350 MG/ML SOLN
75.0000 mL | Freq: Once | INTRAVENOUS | Status: AC | PRN
  Administered 2021-02-16: 75 mL via INTRAVENOUS

## 2021-02-16 MED ORDER — LIDOCAINE 5 % EX PTCH
1.0000 | MEDICATED_PATCH | CUTANEOUS | Status: DC
  Administered 2021-02-16: 1 via TRANSDERMAL
  Filled 2021-02-16: qty 1

## 2021-02-16 MED ORDER — ACETAMINOPHEN 500 MG PO TABS
1000.0000 mg | ORAL_TABLET | Freq: Once | ORAL | Status: AC
  Administered 2021-02-16: 1000 mg via ORAL
  Filled 2021-02-16: qty 2

## 2021-02-16 NOTE — ED Triage Notes (Signed)
Pt via POV from home. Pt c/o SOB, headache, weakness, and upper back pain. Pt has been feeling back since 6/12 was seen by her PCP and given antibiotics and pt states she feels worse. Pt is A&Ox4 and NAD,

## 2021-02-16 NOTE — ED Notes (Signed)
Patient not seen in room for over 20 minutes Bathrooms and waiting room checked, patient not present Patient's PIV was not removed by this RN or previous RN Patient assumed to have left AMA

## 2021-02-16 NOTE — ED Provider Notes (Signed)
[facility] Emergency Department Provider Note  ____________________________________________   Event Date/Time   First MD Initiated Contact with Patient 02/16/21 1955     (approximate)  I have reviewed the triage vital signs and the nursing notes.   HISTORY  Chief Complaint Shortness of Breath    HPI Teresa Wise is a 45 y.o. female with anxiety, depression who comes in with shortness of breath, headache, weakness, upper back pain cough, congestion, sore throat, ear pain.  Patient is been feeling bad since 6/12 and was seen by her PCP and given antibiotics states that she feels worse.  Patient reports being on a course of cefdinir.  She completed this course.  However she is now developed more pain in her upper back, pain with breathing, shortness of breath.  Symptoms are moderate, intermittent, worse with exertion, better at rest.  Denies any leg swelling.  No abdominal pain.          Past Medical History:  Diagnosis Date   Anxiety    Depressed    History of shingles    Medical history non-contributory    Ovarian cyst     Patient Active Problem List   Diagnosis Date Noted   Symptomatic cholelithiasis 08/03/2018   Severe recurrent major depression without psychotic features (HCC) 04/22/2016   Suicidal ideation 04/22/2016    Past Surgical History:  Procedure Laterality Date   APPENDECTOMY     CHOLECYSTECTOMY N/A 08/30/2018   Procedure: LAPAROSCOPIC CHOLECYSTECTOMY;  Surgeon: Henrene Dodge, MD;  Location: [facility] ORS;  Service: General;  Laterality: N/A;   TUBAL LIGATION Bilateral    TUBAL LIGATION      Prior to Admission medications   Medication Sig Start Date End Date Taking? Authorizing Provider  ALPRAZolam Prudy Feeler) 1 MG tablet Take 1 mg by mouth 4 (four) times daily.    [provider]  ALPRAZolam Prudy Feeler) 1 MG tablet Take 1 tablet (1 mg total) by mouth 3 (three) times daily as needed for up to 6 doses for anxiety. 08/23/19   Darci Current, MD  cyclobenzaprine (FLEXERIL) 5 MG tablet Take 1 tablet (5 mg total) by mouth 3 (three) times daily as needed for muscle spasms. 10/07/18   Menshew, Charlesetta Ivory, PA-C  ibuprofen (ADVIL) 600 MG tablet Take 1 tablet (600 mg total) by mouth every 8 (eight) hours as needed for moderate pain or cramping. 01/16/21   Shaune Pollack, MD  ketorolac (TORADOL) 10 MG tablet Take 1 tablet (10 mg total) by mouth every 8 (eight) hours. 10/07/18   Menshew, Charlesetta Ivory, PA-C  ondansetron (ZOFRAN ODT) 4 MG disintegrating tablet Take 1 tablet (4 mg total) by mouth every 8 (eight) hours as needed for nausea or vomiting. 01/16/21   Shaune Pollack, MD    Allergies Adhesive [tape]  History reviewed. No pertinent family history.  Social History Social History   Tobacco Use   Smoking status: Every Day    Packs/day: 1.00    Pack years: 0.00    Types: Cigarettes   Smokeless tobacco: Never  Vaping Use   Vaping Use: Never used  Substance Use Topics   Alcohol use: No   Drug use: No      Review of Systems Constitutional: No fever/chills Eyes: No visual changes. ENT: Left ear pain, congestion Cardiovascular: No chest pain Respiratory: Positive for SOB Gastrointestinal: No abdominal pain.  No nausea, no vomiting.  No diarrhea.  No constipation. Genitourinary: Negative for dysuria. Musculoskeletal: Upper back pain Skin:  Negative for rash. Neurological: Negative for headaches, focal weakness or numbness. All other ROS negative ____________________________________________   PHYSICAL EXAM:  VITAL SIGNS: ED Triage Vitals [02/16/21 1450]  Enc Vitals Group     BP 117/84     Pulse Rate (!) 107     Resp 20     Temp 98.3 F (36.8 C)     Temp Source Oral     SpO2 97 %     Weight 178 lb (80.7 kg)     Height 5' (1.524 m)     Head Circumference      Peak Flow      Pain Score 5     Pain Loc      Pain Edu?      Excl. in GC?     Constitutional: Alert and oriented. Well appearing and  in no acute distress. Eyes: Conjunctivae are normal. EOMI. Head: Atraumatic.  TMs clear bilaterally Nose: No congestion/rhinnorhea. Mouth/Throat: Mucous membranes are moist.  OP clear without any uvula deviation or tonsillar mass Neck: No stridor. Trachea Midline. FROM Cardiovascular: Tachycardic, regular rhythm. Grossly normal heart sounds.  Good peripheral circulation. Respiratory: Clear lungs bilaterally, no increased work of breathing Gastrointestinal: Soft and nontender. No distention. No abdominal bruits.  Musculoskeletal: No lower extremity tenderness nor edema.  No joint effusions. Neurologic:  Normal speech and language. No gross focal neurologic deficits are appreciated.  Skin:  Skin is warm, dry and intact. No rash noted. Psychiatric: Mood and affect are normal. Speech and behavior are normal. GU: Deferred   ____________________________________________   LABS (all labs ordered are listed, but only abnormal results are displayed)  Labs Reviewed  BASIC METABOLIC PANEL - Abnormal; Notable for the following components:      Result Value   Glucose, Bld 109 (*)    Calcium 8.6 (*)    All other components within normal limits  CBC - Abnormal; Notable for the following components:   Platelets 416 (*)    All other components within normal limits  POC URINE PREG, ED  TROPONIN I (HIGH SENSITIVITY)  TROPONIN I (HIGH SENSITIVITY)   ____________________________________________   ED ECG REPORT I, Concha Se, the attending physician, personally viewed and interpreted this ECG.  Sinus tachycardia rate of 106, no ST elevation, no T wave inversions, normal intervals ____________________________________________  RADIOLOGY Vela Prose, personally viewed and evaluated these images (plain radiographs) as part of my medical decision making, as well as reviewing the written report by the radiologist.  ED MD interpretation: No pneumonia  Official radiology report(s): DG Chest 2  View  Result Date: 02/16/2021 CLINICAL DATA:  Shortness of breath and dry cough. EXAM: CHEST - 2 VIEW COMPARISON:  CT of Jan 16, 2021 and chest x-ray of August 28, 2016. FINDINGS: Trachea midline. Cardiomediastinal contours and hilar structures are normal. Lungs are clear. Redemonstration of prominent RIGHT cardiophrenic fat pad. No sign of effusion. On limited assessment no acute skeletal process. IMPRESSION: No active cardiopulmonary disease. Electronically Signed   By: Donzetta Kohut M.D.   On: 02/16/2021 15:19    ____________________________________________   PROCEDURES  Procedure(s) performed (including Critical Care):  .1-3 Lead EKG Interpretation  Date/Time: 02/16/2021 8:40 PM Performed by: Concha Se, MD Authorized by: Concha Se, MD     Interpretation: abnormal     ECG rate:  100s   ECG rate assessment: tachycardic     Rhythm: sinus tachycardia     Ectopy: none     Conduction:  normal     ____________________________________________   INITIAL IMPRESSION / ASSESSMENT AND PLAN / ED COURSE   Teresa Wise was evaluated in Emergency Department on 02/16/2021 for the symptoms described in the history of present illness. She was evaluated in the context of the global COVID-19 pandemic, which necessitated consideration that the patient might be at risk for infection with the SARS-CoV-2 virus that causes COVID-19. Institutional protocols and algorithms that pertain to the evaluation of patients at risk for COVID-19 are in a state of rapid change based on information released by regulatory bodies including the CDC and federal and state organizations. These policies and algorithms were followed during the patient's care in the ED.     Patient presents with multiple symptoms that sound more likely viral versus allergies given patient's already had a treatment of cefdinir which would have treated any bacterial cause she is afebrile and very well-appearing.  However given the pleuritic  component of pain with tachycardia will get CT to evaluate for pulmonary embolism.  PNA-will get xray to evaluation Anemia-CBC to evaluate ACS- will get trops Arrhythmia-Will get EKG and keep on monitor.  COVID- will get testing per algorithm.   Labs are reassuring.  Troponin is normal.  Attempted to update pt that CT reads were taking long and that was delay in dc but pt was not in room and had eloped from ER prior to results       ____________________________________________   FINAL CLINICAL IMPRESSION(S) / ED DIAGNOSES   Final diagnoses:  SOB (shortness of breath)  Tachycardia     MEDICATIONS GIVEN DURING THIS VISIT:  Medications  sodium chloride 0.9 % bolus 500 mL (0 mLs Intravenous Stopped 02/16/21 2143)  acetaminophen (TYLENOL) tablet 1,000 mg (1,000 mg Oral Given 02/16/21 2041)  iohexol (OMNIPAQUE) 350 MG/ML injection 75 mL (75 mLs Intravenous Contrast Given 02/16/21 2110)     ED Discharge Orders     None        Note:  This document was prepared using Dragon voice recognition software and may include unintentional dictation errors.   Concha Se, MD 02/17/21 [phone]

## 2021-12-13 LAB — HM PAP SMEAR: HM Pap smear: NEGATIVE

## 2022-04-14 DIAGNOSIS — F331 Major depressive disorder, recurrent, moderate: Secondary | ICD-10-CM | POA: Diagnosis not present

## 2022-05-15 DIAGNOSIS — F411 Generalized anxiety disorder: Secondary | ICD-10-CM | POA: Diagnosis not present

## 2022-06-02 DIAGNOSIS — N760 Acute vaginitis: Secondary | ICD-10-CM | POA: Diagnosis not present

## 2022-06-19 DIAGNOSIS — F411 Generalized anxiety disorder: Secondary | ICD-10-CM | POA: Diagnosis not present

## 2022-07-03 DIAGNOSIS — F411 Generalized anxiety disorder: Secondary | ICD-10-CM | POA: Diagnosis not present

## 2022-07-22 DIAGNOSIS — F411 Generalized anxiety disorder: Secondary | ICD-10-CM | POA: Diagnosis not present

## 2022-08-06 ENCOUNTER — Ambulatory Visit
Admission: RE | Admit: 2022-08-06 | Discharge: 2022-08-06 | Disposition: A | Discharge status: Home / Self Care | Payer: No Typology Code available for payment source | Attending: Internal Medicine | Admitting: Internal Medicine

## 2022-08-06 ENCOUNTER — Ambulatory Visit
Admission: RE | Admit: 2022-08-06 | Discharge: 2022-08-06 | Disposition: A | Discharge status: Home / Self Care | Payer: No Typology Code available for payment source | Source: Ambulatory Visit | Attending: Internal Medicine | Admitting: Internal Medicine

## 2022-08-06 ENCOUNTER — Encounter: Payer: Self-pay | Admitting: Internal Medicine

## 2022-08-06 ENCOUNTER — Ambulatory Visit: Payer: No Typology Code available for payment source | Admitting: Internal Medicine

## 2022-08-06 VITALS — BP 118/72 | HR 92 | Resp 16 | Ht 60.0 in | Wt 167.0 lb

## 2022-08-06 DIAGNOSIS — L308 Other specified dermatitis: Secondary | ICD-10-CM

## 2022-08-06 DIAGNOSIS — M542 Cervicalgia: Secondary | ICD-10-CM | POA: Insufficient documentation

## 2022-08-06 DIAGNOSIS — Z13220 Encounter for screening for lipoid disorders: Secondary | ICD-10-CM

## 2022-08-06 DIAGNOSIS — F419 Anxiety disorder, unspecified: Secondary | ICD-10-CM

## 2022-08-06 DIAGNOSIS — Z1159 Encounter for screening for other viral diseases: Secondary | ICD-10-CM

## 2022-08-06 DIAGNOSIS — G8929 Other chronic pain: Secondary | ICD-10-CM | POA: Insufficient documentation

## 2022-08-06 DIAGNOSIS — R000 Tachycardia, unspecified: Secondary | ICD-10-CM | POA: Diagnosis not present

## 2022-08-06 DIAGNOSIS — M62830 Muscle spasm of back: Secondary | ICD-10-CM | POA: Diagnosis not present

## 2022-08-06 DIAGNOSIS — Z1231 Encounter for screening mammogram for malignant neoplasm of breast: Secondary | ICD-10-CM

## 2022-08-06 DIAGNOSIS — Z114 Encounter for screening for human immunodeficiency virus [HIV]: Secondary | ICD-10-CM | POA: Diagnosis not present

## 2022-08-06 MED ORDER — TIZANIDINE HCL 4 MG PO TABS: 4.0000 mg | ORAL_TABLET | Freq: Every evening | ORAL | 0 refills | Status: DC | PRN

## 2022-08-06 NOTE — Progress Notes (Signed)
New Patient Office Visit  Subjective    Patient ID: Teresa Wise, female    DOB: 1975/12/06  Age: 46 y.o. MRN: [id number]  CC:  Chief Complaint  Patient presents with   Establish Care   Back Pain    X1 week. Was sitting when back all of a sudden started hurting.    HPI Teresa Wise presents to establish care.  MDD: -Following with Psychiatry at [facility]  -Mood status: stable -Current treatment: Zoloft 50 mg, Buspar 20 mg TID, Propanolol 10 mg just as needed - only takes a few times a month. Xanax 0.5 mg TID -Satisfied with current treatment?: yes -Duration of current treatment :  Zoloft is new, had been on Prozac previously  -Side effects: no Medication compliance: excellent compliance Psychotherapy/counseling: yes current     08/06/2022    9:42 AM  Depression screen PHQ 2/9  Decreased Interest 0  Down, Depressed, Hopeless 0  PHQ - 2 Score 0  Altered sleeping 0  Tired, decreased energy 0  Change in appetite 0  Feeling bad or failure about yourself  0  Trouble concentrating 0  Moving slowly or fidgety/restless 0  Suicidal thoughts 0  PHQ-9 Score 0   Acute Back Pain: -Was wrapping christmas presents while sitting on the floor the other day and she had pain and muscle spasms when she stood back up.  -Active spasms still occurring in thoracic paraspinal muscles, had to take yesterday off from work. -Currently taking Ibuprofen 600 mg which is treating her pain well, also using heat/ice, IcyHot and Lidocaine patches  Chronic Neck Pain: -Had a fall in 2017 and since then has chronic mid neck pain -X-ray from 01/01/2016 showing mild reversal of the normal cervical lordosis consistent with muscle spasm as well as mild degenerative disc space narrowing at C4-C5.   Health Maintenance: -Blood work due -Mammogram due -Pap 4/23 negative   Outpatient Encounter Medications as of 08/06/2022  Medication Sig   ALPRAZolam (XANAX) 1 MG tablet Take 1 tablet (1 mg total) by mouth 3  (three) times daily as needed for up to 6 doses for anxiety. (Patient taking differently: Take 0.5 mg by mouth 3 (three) times daily as needed for anxiety.)   busPIRone (BUSPAR) 10 MG tablet Take 10 mg by mouth daily.   propranolol (INDERAL) 20 MG tablet Take 20 mg by mouth 3 (three) times daily.   sertraline (ZOLOFT) 50 MG tablet Take 50 mg by mouth daily.   [DISCONTINUED] ALPRAZolam (XANAX) 1 MG tablet Take 1 mg by mouth 4 (four) times daily.   cyclobenzaprine (FLEXERIL) 5 MG tablet Take 1 tablet (5 mg total) by mouth 3 (three) times daily as needed for muscle spasms. (Patient not taking: Reported on 08/06/2022)   ibuprofen (ADVIL) 600 MG tablet Take 1 tablet (600 mg total) by mouth every 8 (eight) hours as needed for moderate pain or cramping. (Patient not taking: Reported on 08/06/2022)   ketorolac (TORADOL) 10 MG tablet Take 1 tablet (10 mg total) by mouth every 8 (eight) hours. (Patient not taking: Reported on 08/06/2022)   ondansetron (ZOFRAN ODT) 4 MG disintegrating tablet Take 1 tablet (4 mg total) by mouth every 8 (eight) hours as needed for nausea or vomiting. (Patient not taking: Reported on 08/06/2022)   No facility-administered encounter medications on file as of 08/06/2022.    Past Medical History:  Diagnosis Date   Anxiety    Depressed    History of shingles    Medical history non-contributory  Ovarian cyst     Past Surgical History:  Procedure Laterality Date   APPENDECTOMY     CHOLECYSTECTOMY N/A 08/30/2018   Procedure: LAPAROSCOPIC CHOLECYSTECTOMY;  Surgeon: Henrene Dodge, MD;  Location: [facility] ORS;  Service: General;  Laterality: N/A;   TUBAL LIGATION Bilateral    TUBAL LIGATION      No family history on file.  Social History   Socioeconomic History   Marital status: Married    Spouse name: Not on file   Number of children: Not on file   Years of education: Not on file   Highest education level: Not on file  Occupational History   Not on file  Tobacco Use    Smoking status: Every Day    Packs/day: 1.00    Types: Cigarettes   Smokeless tobacco: Never  Vaping Use   Vaping Use: Never used  Substance and Sexual Activity   Alcohol use: No   Drug use: No   Sexual activity: Yes  Other Topics Concern   Not on file  Social History Narrative   Not on file   Social Determinants of Health   Financial Resource Strain: Not on file  Food Insecurity: Not on file  Transportation Needs: Not on file  Physical Activity: Not on file  Stress: Not on file  Social Connections: Not on file  Intimate Partner Violence: Not on file    Review of Systems  Constitutional:  Negative for chills and fever.  Eyes:  Negative for blurred vision.  Respiratory:  Negative for shortness of breath.   Musculoskeletal:  Positive for back pain and neck pain.      Objective    BP 118/72   Pulse 92   Resp 16   Ht 5' (1.524 m)   Wt 167 lb (75.8 kg)   SpO2 97%   BMI 32.61 kg/m   Physical Exam Constitutional:      Appearance: Normal appearance.  HENT:     Head: Normocephalic and atraumatic.     Mouth/Throat:     Mouth: Mucous membranes are moist.     Pharynx: Oropharynx is clear.  Eyes:     Extraocular Movements: Extraocular movements intact.     Conjunctiva/sclera: Conjunctivae normal.     Pupils: Pupils are equal, round, and reactive to light.  Cardiovascular:     Rate and Rhythm: Normal rate and regular rhythm.  Pulmonary:     Effort: Pulmonary effort is normal.     Breath sounds: Normal breath sounds.  Musculoskeletal:     Cervical back: No rigidity or tenderness.     Right lower leg: No edema.     Left lower leg: No edema.  Skin:    General: Skin is warm and dry.     Comments: Dry patches of skin on bilateral foot arches   Neurological:     General: No focal deficit present.     Mental Status: She is alert. Mental status is at baseline.  Psychiatric:        Mood and Affect: Mood normal.        Behavior: Behavior normal.          Assessment & Plan:   1. Tachycardia: Taking Propanolol 10 mg as needed. Symptoms well controlled, due for screening labs.  - CBC w/Diff/Platelet - COMPLETE METABOLIC PANEL WITH GFR  2. Anxiety: Following with psychiatry at [facility].  Patient recently weaned off of Prozac and started on Zoloft.  She is currently on Zoloft 50 mg, BuSpar 20  mg 3 times daily and Xanax 0.5 mg 3 times daily.  Patient also participating in counseling.  3. Muscle spasm of back: Acute, patient treating with ibuprofen 600 mg as needed.  I will also send a muscle relaxer to her pharmacy to take as needed.  Continue moist heat, ice, gentle stretching, massage, topical medications and lidocaine patches as needed.  - tiZANidine (ZANAFLEX) 4 MG tablet; Take 1 tablet (4 mg total) by mouth at bedtime as needed for muscle spasms.  Dispense: 30 tablet; Refill: 0  4. Other eczema: Present on the arches of her feet bilaterally.  Discussed using heavy-duty moisturizers at least twice a day as well as over-the-counter Benadryl cream to help with itching.  5. Chronic neck pain: Reviewed x-ray from 2017, her pain is most likely due to arthritis.  Will obtain an updated x-ray.  - DG Cervical Spine Complete; Future  6. Lipid screening/Need for hepatitis C screening test/Encounter for screening for HIV: Screening labs ordered.  - Lipid Profile - Hepatitis C Antibody - HIV antibody (with reflex)  7. Encounter for screening mammogram for malignant neoplasm of breast: Mammogram due.  - MM 3D SCREEN BREAST BILATERAL; Future   Return in about 6 months (around 02/05/2023).   Margarita Mail, DO

## 2022-08-06 NOTE — Patient Instructions (Addendum)
It was great seeing you today!  Plan discussed at today's visit: -Blood work ordered today, results will be uploaded to MyChart.  -Mammogram ordered, please call number on the card to schedule -X-ray of neck today -Continue Ibuprofen 600 mg every 12 hours as needed for back pain, muscle relaxer sent to pharmacy as well, start with half a tablet before bed. Do not take and drive  Follow up in: 6 months   Take care and let us know if you have any questions or concerns prior to your next visit.  Dr. Caralee Ates

## 2022-08-07 LAB — CBC WITH DIFFERENTIAL/PLATELET
Absolute Monocytes: 578 cells/uL (ref 200–950)
Basophils Absolute: 46 cells/uL (ref 0–200)
Basophils Relative: 0.6 %
Eosinophils Absolute: 108 cells/uL (ref 15–500)
Eosinophils Relative: 1.4 %
HCT: 43 % (ref 35.0–45.0)
Hemoglobin: 14.4 g/dL (ref 11.7–15.5)
Lymphs Abs: 2441 cells/uL (ref 850–3900)
MCH: 29 pg (ref 27.0–33.0)
MCHC: 33.5 g/dL (ref 32.0–36.0)
MCV: 86.5 fL (ref 80.0–100.0)
MPV: 10.6 fL (ref 7.5–12.5)
Monocytes Relative: 7.5 %
Neutro Abs: 4528 cells/uL (ref 1500–7800)
Neutrophils Relative %: 58.8 %
Platelets: 369 10*3/uL (ref 140–400)
RBC: 4.97 10*6/uL (ref 3.80–5.10)
RDW: 12.9 % (ref 11.0–15.0)
Total Lymphocyte: 31.7 %
WBC: 7.7 10*3/uL (ref 3.8–10.8)

## 2022-08-07 LAB — COMPLETE METABOLIC PANEL WITH GFR
AG Ratio: 1.6 (calc) (ref 1.0–2.5)
ALT: 12 U/L (ref 6–29)
AST: 11 U/L (ref 10–30)
Albumin: 4.1 g/dL (ref 3.6–5.1)
Alkaline phosphatase (APISO): 54 U/L (ref 31–125)
BUN: 13 mg/dL (ref 7–25)
CO2: 24 mmol/L (ref 20–32)
Calcium: 9.1 mg/dL (ref 8.6–10.2)
Chloride: 109 mmol/L (ref 98–110)
Creat: 0.9 mg/dL (ref 0.50–0.99)
Globulin: 2.6 g/dL (calc) (ref 1.9–3.7)
Glucose, Bld: 93 mg/dL (ref 65–99)
Potassium: 4.4 mmol/L (ref 3.5–5.3)
Sodium: 141 mmol/L (ref 135–146)
Total Bilirubin: 0.3 mg/dL (ref 0.2–1.2)
Total Protein: 6.7 g/dL (ref 6.1–8.1)
eGFR: 81 mL/min/{1.73_m2} (ref >=60)

## 2022-08-07 LAB — LIPID PANEL
Cholesterol: 225 mg/dL — ABNORMAL HIGH (ref <200)
HDL: 49 mg/dL — ABNORMAL LOW (ref >=50)
LDL Cholesterol (Calc): 138 mg/dL (calc) — ABNORMAL HIGH
Non-HDL Cholesterol (Calc): 176 mg/dL (calc) — ABNORMAL HIGH (ref <130)
Total CHOL/HDL Ratio: 4.6 (calc) (ref <5.0)
Triglycerides: 234 mg/dL — ABNORMAL HIGH (ref <150)

## 2022-08-07 LAB — HIV ANTIBODY (ROUTINE TESTING W REFLEX): HIV 1&2 Ab, 4th Generation: NONREACTIVE

## 2022-08-07 LAB — HEPATITIS C ANTIBODY: Hepatitis C Ab: NONREACTIVE

## 2022-08-20 DIAGNOSIS — K529 Noninfective gastroenteritis and colitis, unspecified: Secondary | ICD-10-CM | POA: Diagnosis not present

## 2022-08-20 DIAGNOSIS — R109 Unspecified abdominal pain: Secondary | ICD-10-CM | POA: Diagnosis present

## 2022-08-21 ENCOUNTER — Emergency Department
Admission: EM | Admit: 2022-08-21 | Discharge: 2022-08-21 | Disposition: A | Discharge status: Home / Self Care | Payer: No Typology Code available for payment source | Attending: Emergency Medicine | Admitting: Emergency Medicine

## 2022-08-21 ENCOUNTER — Encounter: Payer: Self-pay | Admitting: Emergency Medicine

## 2022-08-21 DIAGNOSIS — K529 Noninfective gastroenteritis and colitis, unspecified: Secondary | ICD-10-CM

## 2022-08-21 LAB — PREGNANCY, URINE: Preg Test, Ur: NEGATIVE

## 2022-08-21 LAB — COMPREHENSIVE METABOLIC PANEL
ALT: 17 U/L (ref 0–44)
AST: 16 U/L (ref 15–41)
Albumin: 4.1 g/dL (ref 3.5–5.0)
Alkaline Phosphatase: 55 U/L (ref 38–126)
Anion gap: 9 (ref 5–15)
BUN: 20 mg/dL (ref 6–20)
CO2: 19 mmol/L — ABNORMAL LOW (ref 22–32)
Calcium: 8.8 mg/dL — ABNORMAL LOW (ref 8.9–10.3)
Chloride: 113 mmol/L — ABNORMAL HIGH (ref 98–111)
Creatinine, Ser: 0.77 mg/dL (ref 0.44–1.00)
GFR, Estimated: >60 mL/min (ref >60)
Glucose, Bld: 140 mg/dL — ABNORMAL HIGH (ref 70–99)
Potassium: 3.5 mmol/L (ref 3.5–5.1)
Sodium: 141 mmol/L (ref 135–145)
Total Bilirubin: 0.7 mg/dL (ref 0.3–1.2)
Total Protein: 7.2 g/dL (ref 6.5–8.1)

## 2022-08-21 LAB — CBC
HCT: 46.4 % — ABNORMAL HIGH (ref 36.0–46.0)
Hemoglobin: 15.3 g/dL — ABNORMAL HIGH (ref 12.0–15.0)
MCH: 29 pg (ref 26.0–34.0)
MCHC: 33 g/dL (ref 30.0–36.0)
MCV: 87.9 fL (ref 80.0–100.0)
Platelets: 403 10*3/uL — ABNORMAL HIGH (ref 150–400)
RBC: 5.28 MIL/uL — ABNORMAL HIGH (ref 3.87–5.11)
RDW: 13 % (ref 11.5–15.5)
WBC: 17.1 10*3/uL — ABNORMAL HIGH (ref 4.0–10.5)
nRBC: 0 % (ref 0.0–0.2)

## 2022-08-21 LAB — URINALYSIS, ROUTINE W REFLEX MICROSCOPIC
Bilirubin Urine: NEGATIVE
Glucose, UA: NEGATIVE mg/dL
Hgb urine dipstick: NEGATIVE
Ketones, ur: 20 mg/dL — AB
Leukocytes,Ua: NEGATIVE
Nitrite: POSITIVE — AB
Protein, ur: 30 mg/dL — AB
Specific Gravity, Urine: 1.029 (ref 1.005–1.030)
pH: 5 (ref 5.0–8.0)

## 2022-08-21 LAB — LIPASE, BLOOD: Lipase: 32 U/L (ref 11–51)

## 2022-08-21 MED ORDER — ONDANSETRON 4 MG PO TBDP: 4.0000 mg | ORAL_TABLET | Freq: Three times a day (TID) | ORAL | 0 refills | Status: DC | PRN

## 2022-08-21 MED ORDER — ONDANSETRON 4 MG PO TBDP
4.0000 mg | ORAL_TABLET | Freq: Once | ORAL | Status: AC
  Administered 2022-08-21: 4 mg via ORAL
  Filled 2022-08-21: qty 1

## 2022-08-21 MED ORDER — ONDANSETRON 4 MG PO TBDP
4.0000 mg | ORAL_TABLET | Freq: Once | ORAL | Status: AC | PRN
  Administered 2022-08-21: 4 mg via ORAL
  Filled 2022-08-21: qty 1

## 2022-08-21 NOTE — ED Provider Notes (Signed)
[facility] Provider Note    Event Date/Time   First MD Initiated Contact with Patient 08/21/22 [phone]     (approximate)   History   Emesis   HPI  Teresa Wise is a 46 y.o. female who presents with nausea vomiting diarrhea which started last night.  She describes abdominal cramping diffusely.  No sick contacts reported.     Physical Exam   Triage Vital Signs: ED Triage Vitals  Enc Vitals Group     BP 08/21/22 0003 115/83     Pulse Rate 08/21/22 0003 (!) 104     Resp 08/21/22 0003 20     Temp 08/21/22 0003 97.6 F (36.4 C)     Temp Source 08/21/22 0419 Oral     SpO2 08/21/22 0003 98 %     Weight 08/21/22 0003 75.8 kg (167 lb)     Height 08/21/22 0003 1.524 m (5')     Head Circumference --      Peak Flow --      Pain Score 08/21/22 0012 6     Pain Loc --      Pain Edu? --      Excl. in GC? --     Most recent vital signs: Vitals:   08/21/22 0419 08/21/22 0728  BP: 104/74 101/72  Pulse: 86 86  Resp: 15 18  Temp: 97.9 F (36.6 C)   SpO2: 97% 96%     General: Awake, no distress.  CV:  Good peripheral perfusion.  Resp:  Normal effort.  Abd:  No distention.  Soft, nontender, reassuring exam Other:     ED Results / Procedures / Treatments   Labs (all labs ordered are listed, but only abnormal results are displayed) Labs Reviewed  COMPREHENSIVE METABOLIC PANEL - Abnormal; Notable for the following components:      Result Value   Chloride 113 (*)    CO2 19 (*)    Glucose, Bld 140 (*)    Calcium 8.8 (*)    All other components within normal limits  CBC - Abnormal; Notable for the following components:   WBC 17.1 (*)    RBC 5.28 (*)    Hemoglobin 15.3 (*)    HCT 46.4 (*)    Platelets 403 (*)    All other components within normal limits  URINALYSIS, ROUTINE W REFLEX MICROSCOPIC - Abnormal; Notable for the following components:   Color, Urine YELLOW (*)    APPearance HAZY (*)    Ketones, ur 20 (*)    Protein, ur 30 (*)     Nitrite POSITIVE (*)    Bacteria, UA MANY (*)    All other components within normal limits  URINE CULTURE  LIPASE, BLOOD  PREGNANCY, URINE     EKG     RADIOLOGY     PROCEDURES:  Critical Care performed:   Procedures   MEDICATIONS ORDERED IN ED: Medications  ondansetron (ZOFRAN-ODT) disintegrating tablet 4 mg (4 mg Oral Given 08/21/22 0011)  ondansetron (ZOFRAN-ODT) disintegrating tablet 4 mg (4 mg Oral Given 08/21/22 0751)     IMPRESSION / MDM / ASSESSMENT AND PLAN / ED COURSE  I reviewed the triage vital signs and the nursing notes. Patient's presentation is most consistent with acute illness / injury with system symptoms.  Patient presents with nausea vomiting diarrhea as detailed above.  Suspicious for viral gastroenteritis which is prevalent in the community at this time.  Abdominal exam is quite reassuring.  Lab work reviewed, noted elevated  white blood cell count which can be seen in viral gastroenteritis.   Patient feeling better after ODT Zofran, no indication for admission, will discharge home with ODT Zofran, she does not have any dysuria        FINAL CLINICAL IMPRESSION(S) / ED DIAGNOSES   Final diagnoses:  Gastroenteritis     Rx / DC Orders   ED Discharge Orders          Ordered    ondansetron (ZOFRAN-ODT) 4 MG disintegrating tablet  Every 8 hours PRN        08/21/22 0750             Note:  This document was prepared using Dragon voice recognition software and may include unintentional dictation errors.   Jene Every, MD 08/21/22 1524

## 2022-08-21 NOTE — ED Notes (Signed)
Patient discharged to home per MD order. Patient in stable condition, and deemed medically cleared by ED provider for discharge. Discharge instructions reviewed with patient/family using "Teach Back"; verbalized understanding of medication education and administration, and information about follow-up care. Denies further concerns. ° °

## 2022-08-21 NOTE — ED Triage Notes (Signed)
Pt presents via POV with complaints of lower abdominal pain with associated emesis that started tonight. Endorses green/yellow liquid emesis and has had multiple episodes of emesis PTA. Denies CP or SOB.

## 2022-08-23 LAB — URINE CULTURE: Culture: 70000 — AB

## 2022-08-24 NOTE — Progress Notes (Signed)
ED Antimicrobial Stewardship Positive Culture Follow Up   Teresa Wise is an 46 y.o. female who presented to [facility] on 08/21/2022 with a chief complaint of  Chief Complaint  Patient presents with   Emesis    Recent Results (from the past 720 hour(s))  Urine Culture     Status: Abnormal   Collection Time: 08/21/22 12:13 AM   Specimen: Urine, Clean Catch  Result Value Ref Range Status   Specimen Description   Final    URINE, CLEAN CATCH Performed at [facility], [address] [place], [place] [address]    Special Requests   Final    NONE Performed at [facility], [address] [place], [place] [address]    Culture 70,000 COLONIES/mL ESCHERICHIA COLI (A)  Final   Report Status 08/23/2022 FINAL  Final   Organism ID, Bacteria ESCHERICHIA COLI (A)  Final      Susceptibility   Escherichia coli - MIC*    AMPICILLIN >=32 RESISTANT Resistant     CEFAZOLIN <=4 SENSITIVE Sensitive     CEFEPIME <=0.12 SENSITIVE Sensitive     CEFTRIAXONE <=0.25 SENSITIVE Sensitive     CIPROFLOXACIN <=0.25 SENSITIVE Sensitive     GENTAMICIN <=1 SENSITIVE Sensitive     IMIPENEM <=0.25 SENSITIVE Sensitive     NITROFURANTOIN <=16 SENSITIVE Sensitive     TRIMETH/SULFA >=320 RESISTANT Resistant     AMPICILLIN/SULBACTAM 4 SENSITIVE Sensitive     PIP/TAZO <=4 SENSITIVE Sensitive     * 70,000 COLONIES/mL ESCHERICHIA COLI    New antibiotic prescription: None Patient's presentation attributable to gastroenteritis. Patient had no dysuria or other urinary symptoms and UA was not consistent with UTI. No treatment warranted at this time.  ED Provider: Dionne Bucy  Will M. Dareen Piano, PharmD PGY-1 Pharmacy Resident 08/24/2022 1:40 PM

## 2022-08-28 ENCOUNTER — Other Ambulatory Visit: Payer: Self-pay | Admitting: Internal Medicine

## 2022-08-28 DIAGNOSIS — M62830 Muscle spasm of back: Secondary | ICD-10-CM

## 2022-08-28 NOTE — Telephone Encounter (Signed)
Requested medication (s) are due for refill today -no  Requested medication (s) are on the active medication list -yes  Future visit scheduled -yes  Last refill: 08/06/22 #30  Notes to clinic: Pharmacy request: 90 day supply- non delegated Rx  Requested Prescriptions  Pending Prescriptions Disp Refills   tiZANidine (ZANAFLEX) 4 MG tablet [Pharmacy Med Name: TIZANIDINE HCL 4 MG TABLET] 90 tablet 1    Sig: TAKE 1 TABLET BY MOUTH AT BEDTIME AS NEEDED FOR MUSCLE SPASMS.     Not Delegated - Cardiovascular:  Alpha-2 Agonists - tizanidine Failed - 08/28/2022 12:30 PM      Failed - This refill cannot be delegated      Passed - Valid encounter within last 6 months    Recent Outpatient Visits           3 weeks ago Glen Raven, DO       Future Appointments             In 5 months Teodora Medici, [facility], [facility]               Requested Prescriptions  Pending Prescriptions Disp Refills   tiZANidine (ZANAFLEX) 4 MG tablet [Pharmacy Med Name: TIZANIDINE HCL 4 MG TABLET] 90 tablet 1    Sig: TAKE 1 TABLET BY MOUTH AT BEDTIME AS NEEDED FOR MUSCLE SPASMS.     Not Delegated - Cardiovascular:  Alpha-2 Agonists - tizanidine Failed - 08/28/2022 12:30 PM      Failed - This refill cannot be delegated      Passed - Valid encounter within last 6 months    Recent Outpatient Visits           3 weeks ago [facility] Teodora Medici, DO       Future Appointments             In 5 months Teodora Medici, [facility], [facility]

## 2022-09-16 ENCOUNTER — Encounter: Payer: Self-pay | Admitting: Internal Medicine

## 2022-09-16 ENCOUNTER — Other Ambulatory Visit: Payer: Self-pay | Admitting: Internal Medicine

## 2022-09-16 ENCOUNTER — Ambulatory Visit (INDEPENDENT_AMBULATORY_CARE_PROVIDER_SITE_OTHER): Payer: No Typology Code available for payment source | Admitting: Internal Medicine

## 2022-09-16 VITALS — BP 118/72 | HR 107 | Temp 97.9°F | Resp 16 | Ht 60.0 in | Wt 169.5 lb

## 2022-09-16 DIAGNOSIS — H6993 Unspecified Eustachian tube disorder, bilateral: Secondary | ICD-10-CM | POA: Diagnosis not present

## 2022-09-16 DIAGNOSIS — M791 Myalgia, unspecified site: Secondary | ICD-10-CM | POA: Diagnosis not present

## 2022-09-16 DIAGNOSIS — B029 Zoster without complications: Secondary | ICD-10-CM

## 2022-09-16 DIAGNOSIS — M62830 Muscle spasm of back: Secondary | ICD-10-CM

## 2022-09-16 DIAGNOSIS — H9203 Otalgia, bilateral: Secondary | ICD-10-CM

## 2022-09-16 DIAGNOSIS — J069 Acute upper respiratory infection, unspecified: Secondary | ICD-10-CM | POA: Diagnosis not present

## 2022-09-16 LAB — POCT INFLUENZA A/B
Influenza A, POC: NEGATIVE
Influenza B, POC: NEGATIVE

## 2022-09-16 MED ORDER — FLUTICASONE PROPIONATE 50 MCG/ACT NA SUSP: 2.0000 | Freq: Every day | NASAL | 6 refills | Status: DC

## 2022-09-16 MED ORDER — VALACYCLOVIR HCL 1 G PO TABS: 1000.0000 mg | ORAL_TABLET | Freq: Three times a day (TID) | ORAL | 0 refills | Status: AC

## 2022-09-16 NOTE — Progress Notes (Signed)
Acute Office Visit  Subjective:     Patient ID: Teresa Wise, female    DOB: Jan 17, 1976, 47 y.o.   MRN: [id number]  Chief Complaint  Patient presents with   Headache    W/ ear pain    HPI Patient is in today for headache and ear pain. Had Norovirus the end of December. Has been having headaches for the last 3 days. Pain is across forehead. Pain/pressure behind ears. No hearing changes. No tinnitus, no discharge from the ears. No fevers. Has some nasal congestion. No sore throat. Also has a shingles outbreak on her forehead, last outbreak was last month, it cleared up and is starting again. She is taking Ibuprofen for myalgias and cold/flu medications.   Review of Systems  Constitutional:  Positive for chills and malaise/fatigue. Negative for fever.  HENT:  Positive for congestion and ear pain. Negative for ear discharge, hearing loss, sinus pain, sore throat and tinnitus.   Respiratory:  Negative for cough and shortness of breath.   Cardiovascular:  Negative for chest pain.  Neurological:  Positive for headaches.        Objective:    BP 118/72   Pulse (!) 107   Temp 97.9 F (36.6 C)   Resp 16   Ht 5' (1.524 m)   Wt 169 lb 8 oz (76.9 kg)   SpO2 98%   BMI 33.10 kg/m  BP Readings from Last 3 Encounters:  09/16/22 118/72  08/21/22 101/72  08/06/22 118/72   Wt Readings from Last 3 Encounters:  09/16/22 169 lb 8 oz (76.9 kg)  08/21/22 167 lb (75.8 kg)  08/06/22 167 lb (75.8 kg)      Physical Exam Constitutional:      Appearance: Normal appearance. She is well-developed.  HENT:     Head: Normocephalic and atraumatic.     Right Ear: Ear canal and external ear normal.     Left Ear: Ear canal and external ear normal.     Ears:     Comments: Bilateral eustachian tube dysfunction     Nose: Nose normal.     Mouth/Throat:     Mouth: Mucous membranes are moist.     Pharynx: Posterior oropharyngeal erythema present.  Eyes:     Conjunctiva/sclera: Conjunctivae normal.   Cardiovascular:     Rate and Rhythm: Normal rate and regular rhythm.  Pulmonary:     Effort: Pulmonary effort is normal.     Breath sounds: Normal breath sounds.  Skin:    General: Skin is warm and dry.  Neurological:     General: No focal deficit present.     Mental Status: She is alert. Mental status is at baseline.  Psychiatric:        Mood and Affect: Mood normal.        Behavior: Behavior normal.     No results found for any visits on 09/16/22.      Assessment & Plan:   1. Upper respiratory tract infection, unspecified type/Dysfunction of both eustachian tubes/Otalgia of both ears/Myalgia: Tested for COVID and flu today. Treat with nasal steroids and decongestants.   - fluticasone (FLONASE) 50 MCG/ACT nasal spray; Place 2 sprays into both nostrils daily.  Dispense: 16 g; Refill: 6 - POCT Influenza A/B - Novel Coronavirus, NAA ([facility])  2. Herpes zoster without complication: Treat with Valtrex 1000 mg TID x 7 days.   - valACYclovir (VALTREX) 1000 MG tablet; Take 1 tablet (1,000 mg total) by mouth 3 (three) times daily for  7 days.  Dispense: 21 tablet; Refill: 0  Return if symptoms worsen or fail to improve.  Teodora Medici, DO

## 2022-09-16 NOTE — Telephone Encounter (Signed)
Requested medication (s) are due for refill today: no  Requested medication (s) are on the active medication list: yes  Last refill:  08/29/22 #30  Future visit scheduled: yes  Notes to clinic:  med not delegated to NT to RF   Requested Prescriptions  Pending Prescriptions Disp Refills   tiZANidine (ZANAFLEX) 4 MG tablet [Pharmacy Med Name: TIZANIDINE HCL 4 MG TABLET] 90 tablet 1    Sig: TAKE 1 TABLET BY MOUTH AT BEDTIME AS NEEDED FOR MUSCLE SPASMS.     Not Delegated - Cardiovascular:  Alpha-2 Agonists - tizanidine Failed - 09/16/2022 11:36 AM      Failed - This refill cannot be delegated      Passed - Valid encounter within last 6 months    Recent Outpatient Visits           Today Upper respiratory tract infection, unspecified type   [place], DO   1 month ago [facility] Teodora Medici, DO       Future Appointments             In 4 months Teodora Medici, [facility], [facility]

## 2022-09-16 NOTE — Patient Instructions (Signed)
It was great seeing you today!  Plan discussed at today's visit: -Recommend Flonase 2 sprays on each side twice a day -Can use decongestant for symptoms as well -COVID and flu test today -Valtrex sent to pharmacy as well   Follow up in: as needed  Take care and let us know if you have any questions or concerns prior to your next visit.  Dr. Rosana Berger

## 2022-09-18 LAB — NOVEL CORONAVIRUS, NAA: SARS-CoV-2, NAA: NOT DETECTED

## 2022-09-23 DIAGNOSIS — F411 Generalized anxiety disorder: Secondary | ICD-10-CM | POA: Diagnosis not present

## 2022-10-02 DIAGNOSIS — D2371 Other benign neoplasm of skin of right lower limb, including hip: Secondary | ICD-10-CM | POA: Diagnosis not present

## 2022-10-02 DIAGNOSIS — L578 Other skin changes due to chronic exposure to nonionizing radiation: Secondary | ICD-10-CM | POA: Diagnosis not present

## 2022-10-02 DIAGNOSIS — L301 Dyshidrosis [pompholyx]: Secondary | ICD-10-CM | POA: Diagnosis not present

## 2022-10-09 DIAGNOSIS — R300 Dysuria: Secondary | ICD-10-CM | POA: Diagnosis not present

## 2022-10-09 DIAGNOSIS — N10 Acute tubulo-interstitial nephritis: Secondary | ICD-10-CM | POA: Diagnosis not present

## 2022-10-24 DIAGNOSIS — R070 Pain in throat: Secondary | ICD-10-CM | POA: Diagnosis not present

## 2022-10-24 DIAGNOSIS — H6692 Otitis media, unspecified, left ear: Secondary | ICD-10-CM | POA: Diagnosis not present

## 2022-10-29 ENCOUNTER — Ambulatory Visit (INDEPENDENT_AMBULATORY_CARE_PROVIDER_SITE_OTHER): Payer: No Typology Code available for payment source | Admitting: Physician Assistant

## 2022-10-29 ENCOUNTER — Encounter: Payer: Self-pay | Admitting: Physician Assistant

## 2022-10-29 ENCOUNTER — Ambulatory Visit: Payer: Self-pay

## 2022-10-29 VITALS — BP 98/62 | HR 93 | Temp 97.9°F | Resp 16 | Ht 60.0 in | Wt 173.6 lb

## 2022-10-29 DIAGNOSIS — R0602 Shortness of breath: Secondary | ICD-10-CM | POA: Diagnosis not present

## 2022-10-29 DIAGNOSIS — J069 Acute upper respiratory infection, unspecified: Secondary | ICD-10-CM | POA: Diagnosis not present

## 2022-10-29 MED ORDER — PREDNISONE 20 MG PO TABS: ORAL_TABLET | ORAL | 0 refills | Status: DC

## 2022-10-29 NOTE — Telephone Encounter (Signed)
  Chief Complaint: dry cough with congestion Symptoms: SOB with exertion, wheezing at night, sore shest from coughing Frequency: 10/19/22 Pertinent Negatives: Patient denies diazines, runny nose, fever Disposition: '[]'$ ED /'[]'$ Urgent Care (no appt availability in office) / '[]'$ Appointment(In office/virtual)/ '[]'$  [place] [facility]/ '[]'$ Home Care/ '[]'$ Refused Recommended Disposition /'[]'$ [place] [facility]/ '[]'$  Follow-up with PCP Additional Notes: pt taking Mucinex  Reason for Disposition  Continuous (nonstop) coughing interferes with work, school, or sleeping  Answer Assessment - Initial Assessment Questions 1. RESPIRATORY STATUS: "Describe your breathing?" (e.g., wheezing, shortness of breath, unable to speak, severe coughing)      Shortness of breath with exertion 2. ONSET: "When did this breathing problem begin?"      10/19/22 3. PATTERN "Does the difficult breathing come and go, or has it been constant since it started?"      Comes and goes- worse last and this am  4. SEVERITY: "How bad is your breathing?" (e.g., mild, moderate, severe)    - MILD: No SOB at rest, mild SOB with walking, speaks normally in sentences, can lie down, no retractions, pulse < 100.    - MODERATE: SOB at rest, SOB with minimal exertion and prefers to sit, cannot lie down flat, speaks in phrases, mild retractions, audible wheezing, pulse 100-120.    - SEVERE: Very SOB at rest, speaks in single words, struggling to breathe, sitting hunched forward, retractions, pulse > 120      mild 5. RECURRENT SYMPTOM: "Have you had difficulty breathing before?" If Yes, ask: "When was the last time?" and "What happened that time?"      Yes long time  6. CARDIAC HISTORY: "Do you have any history of heart disease?" (e.g., heart attack, angina, bypass surgery, angioplasty)      no 7. LUNG HISTORY: "Do you have any history of lung disease?"  (e.g., pulmonary embolus, asthma, emphysema)     no 8. CAUSE: "What do you think is causing the  breathing problem?"      URI 9. OTHER SYMPTOMS: "Do you have any other symptoms? (e.g., dizziness, runny nose, cough, chest pain, fever)     Cough- congestion taking Mucinex , left ear popping and can't hear as well, fatigue , wheezing at night , mild sore throat 10. O2 SATURATION MONITOR:  "Do you use an oxygen saturation monitor (pulse oximeter) at home?" If Yes, ask: "What is your reading (oxygen level) today?" "What is your usual oxygen saturation reading?" (e.g., 95%)       N/a 11. PREGNANCY: "Is there any chance you are pregnant?" "When was your last menstrual period?"       N/a 12. TRAVEL: "Have you traveled out of the country in the last month?" (e.g., travel history, exposures)       N/a  Protocols used: Breathing Difficulty-A-AH

## 2022-10-29 NOTE — Progress Notes (Signed)
Acute Office Visit   Patient: Teresa Wise   DOB: 07-Nov-1975   47 y.o. Female  MRN: [id number] Visit Date: 10/29/2022  Today's healthcare provider: Dani Gobble Evah Rashid, PA-C  Introduced myself to the patient as a Journalist, newspaper and provided education on APPs in clinical practice.    Chief Complaint  Patient presents with   Shortness of Breath    Pt has been felling chest tight and runs out of breath. Pt has had sx cough, throat irritation since 10/19/22. [facility] they did strep test was negative.   Subjective    HPI HPI     Shortness of Breath    Additional comments: Pt has been felling chest tight and runs out of breath. Pt has had sx cough, throat irritation since 10/19/22. [facility] on Friday they did strep test was negative.      Last edited by Salomon Fick, [place] on 10/29/2022  9:46 AM.       URI- type symptoms Onset: gradual Duration: since 10/19/22   She was seen at [facility] on Friday and treated with Augmentin which she is still taking- Augmentin was for ear infection She was tested for Strep at [facility] which was negative   She reports her main concern today is feeling SOB and winded with regular activity  She states she has a log of congestion, dry cough, fullness in her ear and ear popping  Interventions: Flonase 2x per day and Mucinex along with Augmentin     Medications: Outpatient Medications Prior to Visit  Medication Sig   ALPRAZolam (XANAX) 0.5 MG tablet Take 0.5 mg by mouth 3 (three) times daily as needed for anxiety.   amoxicillin-clavulanate (AUGMENTIN) 875-125 MG tablet SMARTSIG:1 Tablet(s) By Mouth Every 12 Hours   busPIRone (BUSPAR) 10 MG tablet Take 20 mg by mouth 3 (three) times daily.   fluticasone (FLONASE) 50 MCG/ACT nasal spray Place 2 sprays into both nostrils daily.   ibuprofen (ADVIL) 600 MG tablet Take 1 tablet (600 mg total) by mouth every 8 (eight) hours as needed for moderate pain or cramping.   ondansetron  (ZOFRAN-ODT) 4 MG disintegrating tablet Take 1 tablet (4 mg total) by mouth every 8 (eight) hours as needed for nausea or vomiting.   propranolol (INDERAL) 10 MG tablet Take 20 mg by mouth as needed (Tachycardia).   sertraline (ZOLOFT) 50 MG tablet Take 50 mg by mouth daily.   tiZANidine (ZANAFLEX) 4 MG tablet TAKE 1 TABLET BY MOUTH AT BEDTIME AS NEEDED FOR MUSCLE SPASMS.   ALPRAZolam (XANAX) 1 MG tablet Take 1 tablet (1 mg total) by mouth 3 (three) times daily as needed for up to 6 doses for anxiety. (Patient not taking: Reported on 10/29/2022)   No facility-administered medications prior to visit.    Review of Systems  Constitutional:  Positive for fatigue. Negative for chills and fever.  HENT:  Positive for congestion, ear pain, sinus pressure and sinus pain. Negative for sore throat.   Respiratory:  Positive for cough and shortness of breath.   Gastrointestinal:  Positive for nausea. Negative for diarrhea and vomiting.  Musculoskeletal:  Positive for myalgias.  Neurological:  Positive for dizziness, light-headedness and headaches.       Objective    BP 98/62   Pulse 93   Temp 97.9 F (36.6 C) (Oral)   Resp 16   Ht 5' (1.524 m)   Wt 173 lb 9.6 oz (78.7 kg)  SpO2 97%   BMI 33.90 kg/m    Physical Exam Vitals reviewed.  Constitutional:      General: She is awake.     Appearance: Normal appearance. She is well-developed and well-groomed.  HENT:     Head: Normocephalic and atraumatic.     Right Ear: Hearing and ear canal normal. A middle ear effusion is present. Tympanic membrane is not injected, scarred, perforated, erythematous, retracted or bulging.     Left Ear: Hearing and ear canal normal. A middle ear effusion is present. Tympanic membrane is erythematous and bulging. Tympanic membrane is not injected, scarred, perforated or retracted.     Ears:     Comments: Mild erythema and bulging in left TM along with mild effusion but no signs of acute otitis media at this time      Mouth/Throat:     Lips: Pink.     Mouth: Mucous membranes are moist.     Pharynx: Oropharynx is clear. Uvula midline.     Tonsils: No tonsillar exudate or tonsillar abscesses.     Comments: Tonsil stone observed in right tonsil  Cardiovascular:     Rate and Rhythm: Normal rate and regular rhythm.     Pulses: Normal pulses.          Radial pulses are 2+ on the right side and 2+ on the left side.     Heart sounds: Normal heart sounds. No murmur heard.    No friction rub. No gallop.  Pulmonary:     Effort: Pulmonary effort is normal.     Breath sounds: Normal breath sounds. No decreased air movement. No decreased breath sounds, wheezing, rhonchi or rales.  Musculoskeletal:     Cervical back: Normal range of motion.  Lymphadenopathy:     Head:     Right side of head: No submental, submandibular or preauricular adenopathy.     Left side of head: No submental, submandibular or preauricular adenopathy.     Cervical:     Right cervical: No superficial or posterior cervical adenopathy.    Left cervical: No superficial or posterior cervical adenopathy.     Upper Body:     Right upper body: No supraclavicular adenopathy.     Left upper body: No supraclavicular adenopathy.  Neurological:     Mental Status: She is alert.  Psychiatric:        Behavior: Behavior is cooperative.       No results found for any visits on 10/29/22.  Assessment & Plan      No follow-ups on file.      Problem List Items Addressed This Visit   None Visit Diagnoses     Upper respiratory tract infection, unspecified type    -  Primary Acute, ongoing concern Patient reports continued nasal congestion, ear fullness, dizziness and chest tightness, SOB with exertion  She is still taking Augmentin for her otitis media- recommend she finish entire course as this will help benefit potential bacterial sinusitis  Recommend she continue with Flonase and Mucinex for nasal congestion and sinus pressure. Reviewed  that Flonase will likely be beneficial for ear fullness as well Will provide script for Prednisone taper to assist with SOBOE and chest tightness Follow up as needed for persistent or progressing symptoms     SOB (shortness of breath) on exertion       Relevant Medications   predniSONE (DELTASONE) 20 MG tablet        No follow-ups on file.   Charlesetta Ivory  E Adilson Grafton, PA-C, have reviewed all documentation for this visit. The documentation on 10/29/22 for the exam, diagnosis, procedures, and orders are all accurate and complete.   Talitha Givens, MHS, PA-C [facility]

## 2022-12-22 ENCOUNTER — Ambulatory Visit: Payer: No Typology Code available for payment source | Admitting: Internal Medicine

## 2022-12-22 ENCOUNTER — Other Ambulatory Visit (HOSPITAL_COMMUNITY)
Admission: RE | Admit: 2022-12-22 | Discharge: 2022-12-22 | Disposition: A | Discharge status: Home / Self Care | Payer: No Typology Code available for payment source | Source: Ambulatory Visit | Attending: Internal Medicine | Admitting: Internal Medicine

## 2022-12-22 ENCOUNTER — Other Ambulatory Visit: Payer: Self-pay | Admitting: Internal Medicine

## 2022-12-22 ENCOUNTER — Encounter: Payer: Self-pay | Admitting: Internal Medicine

## 2022-12-22 VITALS — BP 108/72 | HR 128 | Temp 97.9°F | Resp 18 | Ht 60.0 in | Wt 173.9 lb

## 2022-12-22 DIAGNOSIS — R300 Dysuria: Secondary | ICD-10-CM | POA: Insufficient documentation

## 2022-12-22 DIAGNOSIS — R350 Frequency of micturition: Secondary | ICD-10-CM | POA: Insufficient documentation

## 2022-12-22 DIAGNOSIS — F419 Anxiety disorder, unspecified: Secondary | ICD-10-CM

## 2022-12-22 DIAGNOSIS — N3000 Acute cystitis without hematuria: Secondary | ICD-10-CM

## 2022-12-22 LAB — POCT URINALYSIS DIPSTICK
Bilirubin, UA: NEGATIVE
Blood, UA: NEGATIVE
Glucose, UA: NEGATIVE
Ketones, UA: NEGATIVE
Leukocytes, UA: NEGATIVE
Nitrite, UA: NEGATIVE
Odor: NORMAL
Protein, UA: NEGATIVE
Spec Grav, UA: 1.02 (ref 1.010–1.025)
Urobilinogen, UA: 0.2 E.U./dL
pH, UA: 6 (ref 5.0–8.0)

## 2022-12-22 MED ORDER — SERTRALINE HCL 50 MG PO TABS: 50.0000 mg | ORAL_TABLET | Freq: Every day | ORAL | 0 refills | Status: DC

## 2022-12-22 MED ORDER — BUSPIRONE HCL 10 MG PO TABS: 20.0000 mg | ORAL_TABLET | Freq: Three times a day (TID) | ORAL | 0 refills | Status: DC

## 2022-12-22 MED ORDER — ALPRAZOLAM 0.5 MG PO TABS: 0.5000 mg | ORAL_TABLET | Freq: Three times a day (TID) | ORAL | 0 refills | Status: AC | PRN

## 2022-12-22 NOTE — Progress Notes (Signed)
Acute Office Visit  Subjective:     Patient ID: Teresa Wise, female    DOB: Jan 10, 1976, 47 y.o.   MRN: [id number]  Chief Complaint  Patient presents with   Urinary Tract Infection    Onset 1 day frequency and burning    Urinary Tract Infection  Associated symptoms include frequency and urgency. Pertinent negatives include no flank pain or hematuria.   Patient is in today for concerns about UTI. Patient started having dysuria and suprapubic pain that started yesterday. Also having some back pain but did do a lot of physical work on her porch this weekend. No fevers or blood. Does have increased urinary urgency and frequency. Had a UTI last in March, took Augmentin. Denies change in vaginal discharge.   Patient is seeing [facility] for her anxiety but due to an issue with her insurance she was unable to be been and cannot get her medications refilled. She is currently on Zoloft 50 mg, Buspar 20 mg TID and Xanax 0.5 mg TID. Patient has been working on tapering down on the Xanax. Patient took her last doses today.    Review of Systems  Constitutional:  Negative for fever.  Respiratory:  Negative for shortness of breath.   Cardiovascular:  Negative for chest pain.  Genitourinary:  Positive for dysuria, frequency and urgency. Negative for flank pain and hematuria.  Psychiatric/Behavioral:  The patient is nervous/anxious.         Objective:    BP 108/72   Pulse (!) 128   Temp 97.9 F (36.6 C)   Resp 18   Ht 5' (1.524 m)   Wt 173 lb 14.4 oz (78.9 kg)   SpO2 98%   BMI 33.96 kg/m  BP Readings from Last 3 Encounters:  12/22/22 108/72  10/29/22 98/62  09/16/22 118/72   Wt Readings from Last 3 Encounters:  12/22/22 173 lb 14.4 oz (78.9 kg)  10/29/22 173 lb 9.6 oz (78.7 kg)  09/16/22 169 lb 8 oz (76.9 kg)      Physical Exam Constitutional:      Appearance: Normal appearance.  HENT:     Head: Normocephalic and atraumatic.  Eyes:     Conjunctiva/sclera: Conjunctivae normal.   Cardiovascular:     Rate and Rhythm: Normal rate and regular rhythm.  Pulmonary:     Effort: Pulmonary effort is normal.     Breath sounds: Normal breath sounds.  Skin:    General: Skin is warm and dry.  Neurological:     General: No focal deficit present.     Mental Status: She is alert. Mental status is at baseline.  Psychiatric:        Mood and Affect: Mood normal.        Behavior: Behavior normal.     Results for orders placed or performed in visit on 12/22/22  POCT urinalysis dipstick  Result Value Ref Range   Color, UA yellow    Clarity, UA clear    Glucose, UA Negative Negative   Bilirubin, UA neg    Ketones, UA neg    Spec Grav, UA 1.020 1.010 - 1.025   Blood, UA neg    pH, UA 6.0 5.0 - 8.0   Protein, UA Negative Negative   Urobilinogen, UA 0.2 0.2 or 1.0 E.U./dL   Nitrite, UA neg    Leukocytes, UA Negative Negative   Appearance clear    Odor normal         Assessment & Plan:   1. Urinary  frequency/Burning with urination: UA in the office negative, will send for culture. Obtain vaginal swab as well. Could be a component of stress induced interstitial cystitis occurring. Recommend increasing oral hydration and taking Azo as  needed.  - POCT urinalysis dipstick - Cervicovaginal ancillary only - Urine Culture  2. Anxiety: I discussed that I would send in her anxiety medication for 30 days only. PDMP reviewed.   - sertraline (ZOLOFT) 50 MG tablet; Take 1 tablet (50 mg total) by mouth daily.  Dispense: 30 tablet; Refill: 0 - busPIRone (BUSPAR) 10 MG tablet; Take 2 tablets (20 mg total) by mouth 3 (three) times daily.  Dispense: 180 tablet; Refill: 0 - ALPRAZolam (XANAX) 0.5 MG tablet; Take 1 tablet (0.5 mg total) by mouth 3 (three) times daily as needed for anxiety.  Dispense: 90 tablet; Refill: 0    Return if symptoms worsen or fail to improve.  Margarita Mail, DO

## 2022-12-23 LAB — CERVICOVAGINAL ANCILLARY ONLY
Bacterial Vaginitis (gardnerella): NEGATIVE
Candida Glabrata: NEGATIVE
Candida Vaginitis: NEGATIVE
Chlamydia: NEGATIVE
Comment: NEGATIVE
Comment: NEGATIVE
Comment: NEGATIVE
Comment: NEGATIVE
Comment: NEGATIVE
Comment: NORMAL
Neisseria Gonorrhea: NEGATIVE
Trichomonas: NEGATIVE

## 2022-12-23 NOTE — Telephone Encounter (Signed)
Requested medication (s) are due for refill today: yes  Requested medication (s) are on the active medication list: yes  Last refill:  12/22/22  Future visit scheduled: yes  Notes to clinic:  Pharmacy comment: Alternative Requested:INSURANCE REQUIRES 90 DAY SUPPLY. THANKS.      Requested Prescriptions  Pending Prescriptions Disp Refills   sertraline (ZOLOFT) 50 MG tablet [Pharmacy Med Name: SERTRALINE HCL 50 MG TABLET] 30 tablet 0    Sig: TAKE 1 TABLET BY MOUTH EVERY DAY     Psychiatry:  Antidepressants - SSRI - sertraline Passed - 12/22/2022 11:50 AM      Passed - AST in normal range and within 360 days    AST  Date Value Ref Range Status  08/21/2022 16 15 - 41 U/L Final   SGOT(AST)  Date Value Ref Range Status  08/08/2014 18 15 - 37 Unit/L Final         Passed - ALT in normal range and within 360 days    ALT  Date Value Ref Range Status  08/21/2022 17 0 - 44 U/L Final   SGPT (ALT)  Date Value Ref Range Status  08/08/2014 25 U/L Final    Comment:    14-63 NOTE: New Reference Range 03/14/14          Passed - Completed PHQ-2 or PHQ-9 in the last 360 days      Passed - Valid encounter within last 6 months    Recent Outpatient Visits           Yesterday Urinary frequency   [facility] Margarita Mail, DO   1 month ago Upper respiratory tract infection, unspecified type   [facility] Mecum, Oswaldo Conroy, PA-C   3 months ago Upper respiratory tract infection, unspecified type   [facility] Margarita Mail, DO   4 months ago Tachycardia   [facility] Margarita Mail, DO       Future Appointments             In 1 month Margarita Mail, DO [facility], [facility]

## 2022-12-24 LAB — URINE CULTURE
MICRO NUMBER:: 14887672
SPECIMEN QUALITY:: ADEQUATE

## 2022-12-24 MED ORDER — SULFAMETHOXAZOLE-TRIMETHOPRIM 800-160 MG PO TABS: 1.0000 | ORAL_TABLET | Freq: Two times a day (BID) | ORAL | 0 refills | Status: AC

## 2022-12-24 NOTE — Addendum Note (Signed)
Addended by: Margarita Mail on: 12/24/2022 08:29 AM   Modules accepted: Orders

## 2022-12-25 ENCOUNTER — Telehealth: Payer: Self-pay | Admitting: Internal Medicine

## 2022-12-25 ENCOUNTER — Other Ambulatory Visit: Payer: Self-pay | Admitting: Internal Medicine

## 2022-12-25 DIAGNOSIS — F419 Anxiety disorder, unspecified: Secondary | ICD-10-CM

## 2022-12-25 DIAGNOSIS — F332 Major depressive disorder, recurrent severe without psychotic features: Secondary | ICD-10-CM

## 2022-12-25 NOTE — Telephone Encounter (Signed)
Copied from CRM [id number]. Topic: Referral - Request for Referral >> Dec 25, 2022  2:31 PM Marlow Baars wrote: Has patient seen PCP for this complaint? Yes.   Referral for which specialty: Psychiatry Preferred provider/office: [facility] Reason for referral: Psychiatrist she was seeing no longer takes her insurance  Please assist patient further

## 2022-12-31 ENCOUNTER — Ambulatory Visit
Admission: RE | Admit: 2022-12-31 | Discharge: 2022-12-31 | Disposition: A | Discharge status: Home / Self Care | Payer: No Typology Code available for payment source | Source: Ambulatory Visit | Attending: Internal Medicine | Admitting: Internal Medicine

## 2022-12-31 DIAGNOSIS — Z1231 Encounter for screening mammogram for malignant neoplasm of breast: Secondary | ICD-10-CM | POA: Insufficient documentation

## 2023-01-13 ENCOUNTER — Other Ambulatory Visit: Payer: Self-pay | Admitting: Internal Medicine

## 2023-01-13 DIAGNOSIS — F419 Anxiety disorder, unspecified: Secondary | ICD-10-CM

## 2023-01-13 NOTE — Telephone Encounter (Signed)
Requested medication (s) are due for refill today: na  Requested medication (s) are on the active medication list: yes  Last refill:  12/22/22- 01/21/23 #180 0 refills  Future visit scheduled: yes in 3 weeks   Notes to clinic:  Pharmacy comment: REQUEST FOR 90 DAYS PRESCRIPTION. DX Code Needed.   Do you want to refill Rx?     Requested Prescriptions  Pending Prescriptions Disp Refills   busPIRone (BUSPAR) 10 MG tablet [Pharmacy Med Name: BUSPIRONE HCL 10 MG TABLET] 540 tablet 1    Sig: TAKE 2 TABLETS BY MOUTH 3 TIMES DAILY.     Psychiatry: Anxiolytics/Hypnotics - Non-controlled Passed - 01/13/2023  9:31 AM      Passed - Valid encounter within last 12 months    Recent Outpatient Visits           3 weeks ago Urinary frequency   [facility] Margarita Mail, DO   2 months ago Upper respiratory tract infection, unspecified type   [facility] Mecum, Oswaldo Conroy, PA-C   3 months ago Upper respiratory tract infection, unspecified type   [facility] Margarita Mail, DO   5 months ago Tachycardia   [facility] Margarita Mail, DO       Future Appointments             In 3 weeks Margarita Mail, DO [facility]

## 2023-01-14 DIAGNOSIS — Z5181 Encounter for therapeutic drug level monitoring: Secondary | ICD-10-CM | POA: Diagnosis not present

## 2023-01-14 DIAGNOSIS — F411 Generalized anxiety disorder: Secondary | ICD-10-CM | POA: Diagnosis not present

## 2023-01-14 DIAGNOSIS — Z79899 Other long term (current) drug therapy: Secondary | ICD-10-CM | POA: Diagnosis not present

## 2023-01-14 DIAGNOSIS — F410 Panic disorder [episodic paroxysmal anxiety] without agoraphobia: Secondary | ICD-10-CM | POA: Diagnosis not present

## 2023-01-14 DIAGNOSIS — F1320 Sedative, hypnotic or anxiolytic dependence, uncomplicated: Secondary | ICD-10-CM | POA: Diagnosis not present

## 2023-01-14 DIAGNOSIS — F331 Major depressive disorder, recurrent, moderate: Secondary | ICD-10-CM | POA: Diagnosis not present

## 2023-02-03 NOTE — Progress Notes (Unsigned)
Established Patient Office Visit  Subjective    Patient ID: Teresa Wise, female    DOB: 15-Jul-1976  Age: 47 y.o. MRN: [id number]  CC:  No chief complaint on file.   HPI Teresa Wise presents to follow up on chronic medical conditions.   MDD: -Following with Psychiatry at [facility]  -Mood status: stable -Current treatment: Zoloft 50 mg, Buspar 20 mg TID, Propanolol 10 mg just as needed - only takes a few times a month. Xanax 0.5 mg TID -Satisfied with current treatment?: yes -Duration of current treatment :  Zoloft is new, had been on Prozac previously  -Side effects: no Medication compliance: excellent compliance Psychotherapy/counseling: yes current     12/22/2022   10:47 AM 10/29/2022    9:37 AM 08/06/2022    9:42 AM  Depression screen PHQ 2/9  Decreased Interest 0 0 0  Down, Depressed, Hopeless 0 0 0  PHQ - 2 Score 0 0 0  Altered sleeping 0 0 0  Tired, decreased energy 0 0 0  Change in appetite 0 0 0  Feeling bad or failure about yourself  0 0 0  Trouble concentrating 0 0 0  Moving slowly or fidgety/restless 0 0 0  Suicidal thoughts 0 0 0  PHQ-9 Score 0 0 0  Difficult doing work/chores Not difficult at all Not difficult at all    Acute Back Pain: -Was wrapping christmas presents while sitting on the floor the other day and she had pain and muscle spasms when she stood back up.  -Active spasms still occurring in thoracic paraspinal muscles, had to take yesterday off from work. -Currently taking Ibuprofen 600 mg which is treating her pain well, also using heat/ice, IcyHot and Lidocaine patches  Chronic Neck Pain: -Had a fall in 2017 and since then has chronic mid neck pain -X-ray from 01/01/2016 showing mild reversal of the normal cervical lordosis consistent with muscle spasm as well as mild degenerative disc space narrowing at C4-C5.   Health Maintenance: -Blood work UTD -Mammogram 5/24 -Pap 4/23 negative   Outpatient Encounter Medications as of 02/04/2023   Medication Sig   amoxicillin-clavulanate (AUGMENTIN) 875-125 MG tablet SMARTSIG:1 Tablet(s) By Mouth Every 12 Hours   busPIRone (BUSPAR) 10 MG tablet TAKE 2 TABLETS BY MOUTH 3 TIMES DAILY.   fluticasone (FLONASE) 50 MCG/ACT nasal spray Place 2 sprays into both nostrils daily.   ibuprofen (ADVIL) 600 MG tablet Take 1 tablet (600 mg total) by mouth every 8 (eight) hours as needed for moderate pain or cramping.   ondansetron (ZOFRAN-ODT) 4 MG disintegrating tablet Take 1 tablet (4 mg total) by mouth every 8 (eight) hours as needed for nausea or vomiting.   predniSONE (DELTASONE) 20 MG tablet Take 60mg  PO daily x 2 days, then40mg  PO daily x 2 days, then 20mg  PO daily x 3 days   propranolol (INDERAL) 10 MG tablet Take 20 mg by mouth as needed (Tachycardia).   sertraline (ZOLOFT) 50 MG tablet TAKE 1 TABLET BY MOUTH EVERY DAY   tiZANidine (ZANAFLEX) 4 MG tablet TAKE 1 TABLET BY MOUTH AT BEDTIME AS NEEDED FOR MUSCLE SPASMS.   No facility-administered encounter medications on file as of 02/04/2023.    Past Medical History:  Diagnosis Date   Anxiety    Depressed    History of shingles    Medical history non-contributory    Ovarian cyst     Past Surgical History:  Procedure Laterality Date   APPENDECTOMY     CHOLECYSTECTOMY N/A 08/30/2018  Procedure: LAPAROSCOPIC CHOLECYSTECTOMY;  Surgeon: Henrene Dodge, MD;  Location: [facility] ORS;  Service: General;  Laterality: N/A;   TUBAL LIGATION Bilateral    TUBAL LIGATION      Family History  Problem Relation Age of Onset   Breast cancer Maternal Aunt 57    Social History   Socioeconomic History   Marital status: Married    Spouse name: Not on file   Number of children: Not on file   Years of education: Not on file   Highest education level: Not on file  Occupational History   Not on file  Tobacco Use   Smoking status: Every Day    Packs/day: 1    Types: Cigarettes   Smokeless tobacco: Never  Vaping Use   Vaping Use: Never used   Substance and Sexual Activity   Alcohol use: No   Drug use: No   Sexual activity: Yes  Other Topics Concern   Not on file  Social History Narrative   Not on file   Social Determinants of Health   Financial Resource Strain: Not on file  Food Insecurity: Not on file  Transportation Needs: Not on file  Physical Activity: Not on file  Stress: Not on file  Social Connections: Not on file  Intimate Partner Violence: Not on file    Review of Systems  Constitutional:  Negative for chills and fever.  Eyes:  Negative for blurred vision.  Respiratory:  Negative for shortness of breath.   Musculoskeletal:  Positive for back pain and neck pain.      Objective    There were no vitals taken for this visit.  Physical Exam Constitutional:      Appearance: Normal appearance.  HENT:     Head: Normocephalic and atraumatic.     Mouth/Throat:     Mouth: Mucous membranes are moist.     Pharynx: Oropharynx is clear.  Eyes:     Extraocular Movements: Extraocular movements intact.     Conjunctiva/sclera: Conjunctivae normal.     Pupils: Pupils are equal, round, and reactive to light.  Cardiovascular:     Rate and Rhythm: Normal rate and regular rhythm.  Pulmonary:     Effort: Pulmonary effort is normal.     Breath sounds: Normal breath sounds.  Musculoskeletal:     Cervical back: No rigidity or tenderness.     Right lower leg: No edema.     Left lower leg: No edema.  Skin:    General: Skin is warm and dry.     Comments: Dry patches of skin on bilateral foot arches   Neurological:     General: No focal deficit present.     Mental Status: She is alert. Mental status is at baseline.  Psychiatric:        Mood and Affect: Mood normal.        Behavior: Behavior normal.         Assessment & Plan:   1. Tachycardia: Taking Propanolol 10 mg as needed. Symptoms well controlled, due for screening labs.  - CBC w/Diff/Platelet - COMPLETE METABOLIC PANEL WITH GFR  2. Anxiety:  Following with psychiatry at [facility].  Patient recently weaned off of Prozac and started on Zoloft.  She is currently on Zoloft 50 mg, BuSpar 20 mg 3 times daily and Xanax 0.5 mg 3 times daily.  Patient also participating in counseling.  3. Muscle spasm of back: Acute, patient treating with ibuprofen 600 mg as needed.  I will also send a  muscle relaxer to her pharmacy to take as needed.  Continue moist heat, ice, gentle stretching, massage, topical medications and lidocaine patches as needed.  - tiZANidine (ZANAFLEX) 4 MG tablet; Take 1 tablet (4 mg total) by mouth at bedtime as needed for muscle spasms.  Dispense: 30 tablet; Refill: 0  4. Other eczema: Present on the arches of her feet bilaterally.  Discussed using heavy-duty moisturizers at least twice a day as well as over-the-counter Benadryl cream to help with itching.  5. Chronic neck pain: Reviewed x-ray from 2017, her pain is most likely due to arthritis.  Will obtain an updated x-ray.  - DG Cervical Spine Complete; Future  6. Lipid screening/Need for hepatitis C screening test/Encounter for screening for HIV: Screening labs ordered.  - Lipid Profile - Hepatitis C Antibody - HIV antibody (with reflex)  7. Encounter for screening mammogram for malignant neoplasm of breast: Mammogram due.  - MM 3D SCREEN BREAST BILATERAL; Future   No follow-ups on file.   Margarita Mail, DO

## 2023-02-04 ENCOUNTER — Telehealth: Payer: Self-pay

## 2023-02-04 ENCOUNTER — Other Ambulatory Visit: Payer: Self-pay

## 2023-02-04 ENCOUNTER — Ambulatory Visit: Payer: No Typology Code available for payment source | Admitting: Internal Medicine

## 2023-02-04 ENCOUNTER — Encounter: Payer: Self-pay | Admitting: Internal Medicine

## 2023-02-04 VITALS — BP 102/64 | HR 98 | Temp 98.0°F | Resp 16 | Ht 60.0 in | Wt 173.6 lb

## 2023-02-04 DIAGNOSIS — Z1211 Encounter for screening for malignant neoplasm of colon: Secondary | ICD-10-CM

## 2023-02-04 DIAGNOSIS — F332 Major depressive disorder, recurrent severe without psychotic features: Secondary | ICD-10-CM | POA: Diagnosis not present

## 2023-02-04 MED ORDER — NA SULFATE-K SULFATE-MG SULF 17.5-3.13-1.6 GM/177ML PO SOLN: 1.0000 | Freq: Once | ORAL | 0 refills | Status: AC

## 2023-02-04 NOTE — Telephone Encounter (Signed)
Gastroenterology Pre-Procedure Review  Request Date: 03/30/23 Requesting Physician: Dr. Servando Snare  PATIENT REVIEW QUESTIONS: The patient responded to the following health history questions as indicated:    1. Are you having any GI issues? no 2. Do you have a personal history of Polyps? no 3. Do you have a family history of Colon Cancer or Polyps? yes (mother colon polyps) 4. Diabetes Mellitus? no 5. Joint replacements in the past 12 months?no 6. Major health problems in the past 3 months?no 7. Any artificial heart valves, MVP, or defibrillator?no    MEDICATIONS & ALLERGIES:    Patient reports the following regarding taking any anticoagulation/antiplatelet therapy:   Plavix, Coumadin, Eliquis, Xarelto, Lovenox, Pradaxa, Brilinta, or Effient? no Aspirin? no  Patient confirms/reports the following medications:  Current Outpatient Medications  Medication Sig Dispense Refill   busPIRone (BUSPAR) 10 MG tablet TAKE 2 TABLETS BY MOUTH 3 TIMES DAILY. 540 tablet 0   propranolol (INDERAL) 10 MG tablet Take 20 mg by mouth as needed (Tachycardia).     sertraline (ZOLOFT) 50 MG tablet TAKE 1 TABLET BY MOUTH EVERY DAY 90 tablet 0   No current facility-administered medications for this visit.    Patient confirms/reports the following allergies:  Allergies  Allergen Reactions   Adhesive [Tape] Rash    Dermabond    No orders of the defined types were placed in this encounter.   AUTHORIZATION INFORMATION Primary Insurance: 1D#: Group #:  Secondary Insurance: 1D#: Group #:  SCHEDULE INFORMATION: Date: 03/30/23 Time: Location: [facility]

## 2023-02-11 DIAGNOSIS — N3000 Acute cystitis without hematuria: Secondary | ICD-10-CM | POA: Diagnosis not present

## 2023-02-11 DIAGNOSIS — R300 Dysuria: Secondary | ICD-10-CM | POA: Diagnosis not present

## 2023-02-12 ENCOUNTER — Other Ambulatory Visit: Payer: Self-pay | Admitting: Internal Medicine

## 2023-02-12 DIAGNOSIS — F419 Anxiety disorder, unspecified: Secondary | ICD-10-CM

## 2023-02-12 NOTE — Telephone Encounter (Signed)
Requested Prescriptions  Refused Prescriptions Disp Refills   sertraline (ZOLOFT) 50 MG tablet [Pharmacy Med Name: SERTRALINE HCL 50 MG TABLET] 90 tablet 0    Sig: TAKE 1 TABLET BY MOUTH EVERY DAY     Psychiatry:  Antidepressants - SSRI - sertraline Passed - 02/12/2023  2:36 PM      Passed - AST in normal range and within 360 days    AST  Date Value Ref Range Status  08/21/2022 16 15 - 41 U/L Final   SGOT(AST)  Date Value Ref Range Status  08/08/2014 18 15 - 37 Unit/L Final         Passed - ALT in normal range and within 360 days    ALT  Date Value Ref Range Status  08/21/2022 17 0 - 44 U/L Final   SGPT (ALT)  Date Value Ref Range Status  08/08/2014 25 U/L Final    Comment:    14-63 NOTE: New Reference Range 03/14/14          Passed - Completed PHQ-2 or PHQ-9 in the last 360 days      Passed - Valid encounter within last 6 months    Recent Outpatient Visits           1 week ago Severe recurrent major depression without psychotic features [facility])   [facility] Margarita Mail, DO   1 month ago Urinary frequency   [facility] Margarita Mail, DO   3 months ago Upper respiratory tract infection, unspecified type   [facility] Mecum, Oswaldo Conroy, PA-C   4 months ago Upper respiratory tract infection, unspecified type   [facility] Margarita Mail, DO   6 months ago Tachycardia   [facility] Margarita Mail, DO       Future Appointments             In 5 months Margarita Mail, DO [facility]

## 2023-02-25 DIAGNOSIS — F331 Major depressive disorder, recurrent, moderate: Secondary | ICD-10-CM | POA: Diagnosis not present

## 2023-02-25 DIAGNOSIS — F411 Generalized anxiety disorder: Secondary | ICD-10-CM | POA: Diagnosis not present

## 2023-02-25 DIAGNOSIS — F410 Panic disorder [episodic paroxysmal anxiety] without agoraphobia: Secondary | ICD-10-CM | POA: Diagnosis not present

## 2023-03-20 ENCOUNTER — Encounter: Payer: Self-pay | Admitting: Gastroenterology

## 2023-03-30 ENCOUNTER — Ambulatory Visit: Payer: 59 | Admitting: Anesthesiology

## 2023-03-30 ENCOUNTER — Ambulatory Visit
Admission: RE | Admit: 2023-03-30 | Discharge: 2023-03-30 | Disposition: A | Discharge status: Home / Self Care | Payer: 59 | Attending: Gastroenterology | Admitting: Gastroenterology

## 2023-03-30 ENCOUNTER — Other Ambulatory Visit: Payer: Self-pay

## 2023-03-30 ENCOUNTER — Encounter
Admission: RE | Disposition: A | Discharge status: Home / Self Care | Payer: Self-pay | Source: Home / Self Care | Attending: Gastroenterology

## 2023-03-30 ENCOUNTER — Encounter: Payer: Self-pay | Admitting: Gastroenterology

## 2023-03-30 DIAGNOSIS — Z1211 Encounter for screening for malignant neoplasm of colon: Secondary | ICD-10-CM

## 2023-03-30 DIAGNOSIS — F17210 Nicotine dependence, cigarettes, uncomplicated: Secondary | ICD-10-CM | POA: Insufficient documentation

## 2023-03-30 DIAGNOSIS — K621 Rectal polyp: Secondary | ICD-10-CM

## 2023-03-30 DIAGNOSIS — K5730 Diverticulosis of large intestine without perforation or abscess without bleeding: Secondary | ICD-10-CM | POA: Diagnosis not present

## 2023-03-30 DIAGNOSIS — K635 Polyp of colon: Secondary | ICD-10-CM | POA: Diagnosis not present

## 2023-03-30 DIAGNOSIS — K640 First degree hemorrhoids: Secondary | ICD-10-CM | POA: Insufficient documentation

## 2023-03-30 HISTORY — PX: COLONOSCOPY WITH PROPOFOL: SHX5780

## 2023-03-30 HISTORY — PX: POLYPECTOMY: SHX5525

## 2023-03-30 LAB — POCT PREGNANCY, URINE: Preg Test, Ur: NEGATIVE

## 2023-03-30 SURGERY — COLONOSCOPY WITH PROPOFOL
Anesthesia: General | Site: Rectum

## 2023-03-30 MED ORDER — STERILE WATER FOR IRRIGATION IR SOLN
Status: DC | PRN
  Administered 2023-03-30: 60 mL

## 2023-03-30 MED ORDER — LACTATED RINGERS IV SOLN: INTRAVENOUS | Status: DC | PRN

## 2023-03-30 MED ORDER — STERILE WATER FOR IRRIGATION IR SOLN
Status: DC | PRN
  Administered 2023-03-30: 1

## 2023-03-30 MED ORDER — LIDOCAINE HCL (CARDIAC) PF 100 MG/5ML IV SOSY
PREFILLED_SYRINGE | INTRAVENOUS | Status: DC | PRN
  Administered 2023-03-30: 60 mg via INTRAVENOUS

## 2023-03-30 MED ORDER — PROPOFOL 10 MG/ML IV BOLUS
INTRAVENOUS | Status: DC | PRN
Start: 2023-03-30 — End: 2023-03-30
  Administered 2023-03-30 (×3): 40 mg via INTRAVENOUS
  Administered 2023-03-30: 90 mg via INTRAVENOUS

## 2023-03-30 SURGICAL SUPPLY — 21 items

## 2023-03-30 NOTE — H&P (Signed)
   Teresa Minium, MD [facility] [address], [address] [place], [place] [address] Phone: [phone] Fax : [phone]  Primary Care Physician:  Margarita Mail, DO Primary Gastroenterologist:  Dr. Servando Snare  Pre-Procedure History & Physical: HPI:  Teresa Wise is a 47 y.o. female is here for a screening colonoscopy.   Past Medical History:  Diagnosis Date   Anxiety    Depressed    History of shingles    Medical history non-contributory    Ovarian cyst     Past Surgical History:  Procedure Laterality Date   APPENDECTOMY     CHOLECYSTECTOMY N/A 08/30/2018   Procedure: LAPAROSCOPIC CHOLECYSTECTOMY;  Surgeon: Henrene Dodge, MD;  Location: [facility] ORS;  Service: General;  Laterality: N/A;   TUBAL LIGATION Bilateral    TUBAL LIGATION      Prior to Admission medications   Medication Sig Start Date End Date Taking? Authorizing Provider  ALPRAZolam (XANAX XR) 0.5 MG 24 hr tablet Take 0.5 mg by mouth in the morning, at noon, and at bedtime.   Yes [provider]  busPIRone (BUSPAR) 10 MG tablet TAKE 2 TABLETS BY MOUTH 3 TIMES DAILY. 01/14/23  Yes Margarita Mail, DO  propranolol (INDERAL) 10 MG tablet Take 20 mg by mouth as needed (Tachycardia).   Yes [provider]  sertraline (ZOLOFT) 50 MG tablet TAKE 1 TABLET BY MOUTH EVERY DAY 12/24/22  Yes Margarita Mail, DO    Allergies as of 02/04/2023 - Review Complete 02/04/2023  Allergen Reaction Noted   Adhesive [tape] Rash 09/15/2018    Family History  Problem Relation Age of Onset   Breast cancer Maternal Aunt 48    Social History   Socioeconomic History   Marital status: Married    Spouse name: Not on file   Number of children: Not on file   Years of education: Not on file   Highest education level: Not on file  Occupational History   Not on file  Tobacco Use   Smoking status: Every Day    Current packs/day: 1.00    Types: Cigarettes   Smokeless tobacco: Never  Vaping Use   Vaping status: Never Used   Substance and Sexual Activity   Alcohol use: No   Drug use: No   Sexual activity: Yes  Other Topics Concern   Not on file  Social History Narrative   Not on file   Social Determinants of Health   Financial Resource Strain: Not on file  Food Insecurity: Not on file  Transportation Needs: Not on file  Physical Activity: Not on file  Stress: Not on file  Social Connections: Not on file  Intimate Partner Violence: Not on file    Review of Systems: See HPI, otherwise negative ROS  Physical Exam: Ht 5' (1.524 m)   Wt 79.8 kg   BMI 34.37 kg/m  General:   Alert,  pleasant and cooperative in NAD Head:  Normocephalic and atraumatic. Neck:  Supple; no masses or thyromegaly. Lungs:  Clear throughout to auscultation.    Heart:  Regular rate and rhythm. Abdomen:  Soft, nontender and nondistended. Normal bowel sounds, without guarding, and without rebound.   Neurologic:  Alert and  oriented x4;  grossly normal neurologically.  Impression/Plan: Teresa Wise is now here to undergo a screening colonoscopy.  Risks, benefits, and alternatives regarding colonoscopy have been reviewed with the patient.  Questions have been answered.  All parties agreeable.

## 2023-03-30 NOTE — Anesthesia Preprocedure Evaluation (Signed)
Anesthesia Evaluation  Patient identified by MRN, date of birth, ID band Patient awake    Reviewed: Allergy & Precautions, NPO status , Patient's Chart, lab work & pertinent test results  History of Anesthesia Complications Negative for: history of anesthetic complications  Airway Mallampati: III  TM Distance: >3 FB Neck ROM: full    Dental  (+) Chipped, Poor Dentition   Pulmonary neg shortness of breath, Current Smoker   Pulmonary exam normal        Cardiovascular Exercise Tolerance: Good (-) angina negative cardio ROS Normal cardiovascular exam     Neuro/Psych  PSYCHIATRIC DISORDERS      negative neurological ROS     GI/Hepatic negative GI ROS, Neg liver ROS,neg GERD  ,,  Endo/Other  negative endocrine ROS    Renal/GU negative Renal ROS  negative genitourinary   Musculoskeletal   Abdominal   Peds  Hematology negative hematology ROS (+)   Anesthesia Other Findings Past Medical History: No date: Anxiety No date: Depressed No date: History of shingles No date: Medical history non-contributory No date: Ovarian cyst  Past Surgical History: No date: APPENDECTOMY 08/30/2018: CHOLECYSTECTOMY; N/A     Comment:  Procedure: LAPAROSCOPIC CHOLECYSTECTOMY;  Surgeon:               Henrene Dodge, MD;  Location: [facility] ORS;  Service:               General;  Laterality: N/A; No date: TUBAL LIGATION; Bilateral No date: TUBAL LIGATION  BMI    Body Mass Index: 29.39 kg/m      Reproductive/Obstetrics negative OB ROS                             Anesthesia Physical Anesthesia Plan  ASA: 2  Anesthesia Plan: General   Post-op Pain Management:    Induction: Intravenous  PONV Risk Score and Plan: Propofol infusion and TIVA  Airway Management Planned: Natural Airway and Nasal Cannula  Additional Equipment:   Intra-op Plan:   Post-operative Plan:   Informed Consent: I have reviewed the  patients History and Physical, chart, labs and discussed the procedure including the risks, benefits and alternatives for the proposed anesthesia with the patient or authorized representative who has indicated his/her understanding and acceptance.     Dental Advisory Given  Plan Discussed with: Anesthesiologist, CRNA and Surgeon  Anesthesia Plan Comments: (Patient consented for risks of anesthesia including but not limited to:  - adverse reactions to medications - risk of airway placement if required - damage to eyes, teeth, lips or other oral mucosa - nerve damage due to positioning  - sore throat or hoarseness - Damage to heart, brain, nerves, lungs, other parts of body or loss of life  Patient voiced understanding.)       Anesthesia Quick Evaluation

## 2023-03-30 NOTE — Transfer of Care (Signed)
Immediate Anesthesia Transfer of Care Note  Patient: Teresa Wise  Procedure(s) Performed: COLONOSCOPY WITH PROPOFOL  Patient Location: PACU  Anesthesia Type: General  Level of Consciousness: awake, alert  and patient cooperative  Airway and Oxygen Therapy: Patient Spontanous Breathing and Patient connected to supplemental oxygen  Post-op Assessment: Post-op Vital signs reviewed, Patient's Cardiovascular Status Stable, Respiratory Function Stable, Patent Airway and No signs of Nausea or vomiting  Post-op Vital Signs: Reviewed and stable  Complications: No notable events documented.

## 2023-03-30 NOTE — Op Note (Signed)
[facility] Gastroenterology Patient Name: Teresa Wise Procedure Date: 03/30/2023 10:25 AM MRN: [id number] Account #: [id number] Date of Birth: 07-01-1976 Admit Type: Outpatient Age: 47 Room: [facility] OR ROOM 01 Gender: Female Note Status: Finalized Instrument Name: [id number] Procedure:             Colonoscopy Indications:           Screening for colorectal malignant neoplasm Providers:             Midge Minium MD, MD Medicines:             Propofol per Anesthesia Complications:         No immediate complications. Procedure:             Pre-Anesthesia Assessment:                        - Prior to the procedure, a History and Physical was                         performed, and patient medications and allergies were                         reviewed. The patient's tolerance of previous                         anesthesia was also reviewed. The risks and benefits                         of the procedure and the sedation options and risks                         were discussed with the patient. All questions were                         answered, and informed consent was obtained. Prior                         Anticoagulants: The patient has taken no anticoagulant                         or antiplatelet agents. ASA Grade Assessment: II - A                         patient with mild systemic disease. After reviewing                         the risks and benefits, the patient was deemed in                         satisfactory condition to undergo the procedure.                        After obtaining informed consent, the colonoscope was                         passed under direct vision. Throughout the procedure,                         the patient's blood pressure,  pulse, and oxygen                         saturations were monitored continuously. The                         Colonoscope was introduced through the anus and                         advanced to the the cecum,  identified by appendiceal                         orifice and ileocecal valve. The colonoscopy was                         performed without difficulty. The patient tolerated                         the procedure well. The quality of the bowel                         preparation was excellent. Findings:      The perianal and digital rectal examinations were normal.      A 5 mm polyp was found in the ascending colon. The polyp was sessile.       The polyp was removed with a cold snare. Resection and retrieval were       complete.      A 3 mm polyp was found in the rectum. The polyp was sessile. The polyp       was removed with a cold snare. Resection and retrieval were complete.      A few small-mouthed diverticula were found in the sigmoid colon and       cecum.      Non-bleeding internal hemorrhoids were found during retroflexion. The       hemorrhoids were Grade I (internal hemorrhoids that do not prolapse). Impression:            - One 5 mm polyp in the ascending colon, removed with                         a cold snare. Resected and retrieved.                        - One 3 mm polyp in the rectum, removed with a cold                         snare. Resected and retrieved.                        - Diverticulosis in the sigmoid colon and in the cecum.                        - Non-bleeding internal hemorrhoids. Recommendation:        - Discharge patient to home.                        - Resume previous diet.                        -  Continue present medications.                        - Await pathology results.                        - If the pathology report reveals adenomatous tissue,                         then repeat the colonoscopy for surveillance in 7                         years. Procedure Code(s):     --- Professional ---                        [phone], Colonoscopy, flexible; with removal of                         tumor(s), polyp(s), or other lesion(s) by snare                          technique Diagnosis Code(s):     --- Professional ---                        Z12.11, Encounter for screening for malignant neoplasm                         of colon                        D12.2, Benign neoplasm of ascending colon CPT copyright 2022 American Medical Association. All rights reserved. The codes documented in this report are preliminary and upon coder review may  be revised to meet current compliance requirements. Midge Minium MD, MD 03/30/2023 10:44:26 AM This report has been signed electronically. Number of Addenda: 0 Note Initiated On: 03/30/2023 10:25 AM Scope Withdrawal Time: 0 hours 7 minutes 36 seconds  Total Procedure Duration: 0 hours 11 minutes 30 seconds  Estimated Blood Loss:  Estimated blood loss: none.      [facility]

## 2023-03-30 NOTE — Anesthesia Postprocedure Evaluation (Signed)
Anesthesia Post Note  Patient: Teresa Wise  Procedure(s) Performed: COLONOSCOPY WITH PROPOFOL POLYPECTOMY (Rectum)  Patient location during evaluation: PACU Anesthesia Type: General Level of consciousness: awake and alert Pain management: pain level controlled Vital Signs Assessment: post-procedure vital signs reviewed and stable Respiratory status: spontaneous breathing, nonlabored ventilation, respiratory function stable and patient connected to nasal cannula oxygen Cardiovascular status: blood pressure returned to baseline and stable Postop Assessment: no apparent nausea or vomiting Anesthetic complications: no   No notable events documented.   Last Vitals:  Vitals:   03/30/23 1047 03/30/23 1055  BP: 113/72 111/82  Pulse: 100 84  Resp: 18 16  Temp: 36.7 C   SpO2: 97% 97%    Last Pain:  Vitals:   03/30/23 1055  TempSrc:   PainSc: 0-No pain                 Cleda Mccreedy Venise Ellingwood

## 2023-03-31 ENCOUNTER — Encounter: Payer: Self-pay | Admitting: Gastroenterology

## 2023-04-02 ENCOUNTER — Encounter: Payer: Self-pay | Admitting: Gastroenterology

## 2023-04-29 DIAGNOSIS — F410 Panic disorder [episodic paroxysmal anxiety] without agoraphobia: Secondary | ICD-10-CM | POA: Diagnosis not present

## 2023-04-29 DIAGNOSIS — F411 Generalized anxiety disorder: Secondary | ICD-10-CM | POA: Diagnosis not present

## 2023-04-29 DIAGNOSIS — F331 Major depressive disorder, recurrent, moderate: Secondary | ICD-10-CM | POA: Diagnosis not present

## 2023-05-14 ENCOUNTER — Other Ambulatory Visit (HOSPITAL_COMMUNITY)
Admission: RE | Admit: 2023-05-14 | Discharge: 2023-05-14 | Disposition: A | Discharge status: Home / Self Care | Payer: 59 | Source: Ambulatory Visit | Attending: Nurse Practitioner | Admitting: Nurse Practitioner

## 2023-05-14 ENCOUNTER — Telehealth: Payer: 59 | Admitting: Family Medicine

## 2023-05-14 DIAGNOSIS — N898 Other specified noninflammatory disorders of vagina: Secondary | ICD-10-CM

## 2023-05-14 MED ORDER — FLUCONAZOLE 150 MG PO TABS: 150.0000 mg | ORAL_TABLET | ORAL | 0 refills | Status: DC | PRN

## 2023-05-14 NOTE — Progress Notes (Signed)
Name: Teresa Wise   MRN: [id number]    DOB: August 14, 1976   Date:05/14/2023       Progress Note  Subjective:    Chief Complaint  Chief Complaint  Patient presents with   Vaginal Itching   Vaginal Discharge    white    I connected with  Armstead Peaks  on 05/14/23 at  9:20 AM EDT by a video enabled telemedicine application and verified that I am speaking with the correct person using two identifiers.  I discussed the limitations of evaluation and management by telemedicine and the availability of in person appointments. The patient expressed understanding and agreed to proceed. Staff also discussed with the patient that there may be a patient responsible charge related to this service. Patient Location: parked at the office Provider Location: out of clinic office Additional Individuals present: none  Vaginal Itching The patient's primary symptoms include vaginal discharge.  Vaginal Discharge The patient's primary symptoms include vaginal discharge.   Pt had onset of vaginal/genital itching with thick white discharge She tried to tx with monistat 1  Sx did not improve, they got worse Some minor redness to external genitalia, no rash or sores She has hx of BV about a year ago Her menses are irregular has come twice this past month Sexually active with her husband only  Hx of UTIs No pelvic pain/cramping, urinary sx, abd pain, flank pain    Patient Active Problem List   Diagnosis Date Noted   Special screening for malignant neoplasms, colon 03/30/2023   Polyp of ascending colon 03/30/2023   Tachycardia 08/06/2022   Anxiety 08/06/2022   Symptomatic cholelithiasis 08/03/2018   Severe recurrent major depression without psychotic features (HCC) 04/22/2016   Suicidal ideation 04/22/2016    Social History   Tobacco Use   Smoking status: Every Day    Current packs/day: 1.00    Types: Cigarettes   Smokeless tobacco: Never  Substance Use Topics   Alcohol use: No      Current Outpatient Medications:    ALPRAZolam (XANAX XR) 0.5 MG 24 hr tablet, Take 0.5 mg by mouth in the morning, at noon, and at bedtime., Disp: , Rfl:    busPIRone (BUSPAR) 10 MG tablet, TAKE 2 TABLETS BY MOUTH 3 TIMES DAILY., Disp: 540 tablet, Rfl: 0   propranolol (INDERAL) 10 MG tablet, Take 20 mg by mouth as needed (Tachycardia)., Disp: , Rfl:    sertraline (ZOLOFT) 50 MG tablet, TAKE 1 TABLET BY MOUTH EVERY DAY, Disp: 90 tablet, Rfl: 0  Allergies  Allergen Reactions   Adhesive [Tape] Rash    Dermabond    I personally reviewed active problem list, medication list, allergies, family history, social history, health maintenance, notes from last encounter, lab results, imaging with the patient/caregiver today.   Review of Systems  Constitutional: Negative.   HENT: Negative.    Eyes: Negative.   Respiratory: Negative.    Cardiovascular: Negative.   Gastrointestinal: Negative.   Endocrine: Negative.   Genitourinary:  Positive for vaginal discharge.  Musculoskeletal: Negative.   Skin: Negative.   Allergic/Immunologic: Negative.   Neurological: Negative.   Hematological: Negative.   Psychiatric/Behavioral: Negative.    All other systems reviewed and are negative.     Objective:   Virtual encounter, vitals limited, only able to obtain the following There were no vitals filed for this visit. There is no height or weight on file to calculate BMI. Nursing Note and Vital Signs reviewed.  Physical Exam Vitals and nursing note reviewed.  Constitutional:      General: She is not in acute distress.    Appearance: She is not ill-appearing, toxic-appearing or diaphoretic.  Neurological:     Mental Status: She is alert.     PE limited by virtual encounter  No results found for this or any previous visit (from the past 72 hour(s)).  Assessment and Plan:     ICD-10-CM   1. Vaginal discharge  N89.8 Cervicovaginal ancillary only    fluconazole (DIFLUCAN) 150 MG tablet    hx sounds consistent with yeast infection, swab obtained, will send for testing, low risk for STD, tx with diflucan and follow cytology results       -Red flags and when to present for emergency care or RTC including fever >101.23F, chest pain, shortness of breath, new/worsening/un-resolving symptoms, reviewed with patient at time of visit. Follow up and care instructions discussed and provided in AVS. - I discussed the assessment and treatment plan with the patient. The patient was provided an opportunity to ask questions and all were answered. The patient agreed with the plan and demonstrated an understanding of the instructions.  I provided 18+ minutes of non-face-to-face time during this encounter.  Danelle Berry, PA-C 05/14/23 9:23 AM

## 2023-05-15 ENCOUNTER — Ambulatory Visit: Payer: No Typology Code available for payment source | Admitting: Physician Assistant

## 2023-05-15 LAB — CERVICOVAGINAL ANCILLARY ONLY
Bacterial Vaginitis (gardnerella): NEGATIVE
Candida Glabrata: NEGATIVE
Candida Vaginitis: NEGATIVE
Chlamydia: NEGATIVE
Comment: NEGATIVE
Comment: NEGATIVE
Comment: NEGATIVE
Comment: NEGATIVE
Comment: NEGATIVE
Comment: NORMAL
Neisseria Gonorrhea: NEGATIVE
Trichomonas: NEGATIVE

## 2023-05-26 ENCOUNTER — Ambulatory Visit
Admission: RE | Admit: 2023-05-26 | Discharge: 2023-05-26 | Disposition: A | Discharge status: Home / Self Care | Payer: 59 | Source: Ambulatory Visit | Attending: Physician Assistant | Admitting: Physician Assistant

## 2023-05-26 ENCOUNTER — Encounter: Payer: Self-pay | Admitting: Physician Assistant

## 2023-05-26 ENCOUNTER — Ambulatory Visit (INDEPENDENT_AMBULATORY_CARE_PROVIDER_SITE_OTHER): Payer: 59 | Admitting: Physician Assistant

## 2023-05-26 ENCOUNTER — Ambulatory Visit
Admission: RE | Admit: 2023-05-26 | Discharge: 2023-05-26 | Disposition: A | Discharge status: Home / Self Care | Payer: 59 | Attending: Physician Assistant | Admitting: Physician Assistant

## 2023-05-26 VITALS — BP 102/66 | HR 102 | Temp 98.0°F | Resp 16 | Ht 60.0 in | Wt 178.3 lb

## 2023-05-26 DIAGNOSIS — M545 Low back pain, unspecified: Secondary | ICD-10-CM | POA: Insufficient documentation

## 2023-05-26 DIAGNOSIS — G8929 Other chronic pain: Secondary | ICD-10-CM | POA: Insufficient documentation

## 2023-05-26 MED ORDER — MELOXICAM 15 MG PO TABS
15.0000 mg | ORAL_TABLET | Freq: Every day | ORAL | 0 refills | Status: AC
Start: 2023-05-26 — End: ?

## 2023-05-26 NOTE — Patient Instructions (Addendum)
  I recommend the following at this time to help relieve that discomfort:  Rest Warm compresses to the area (20 minutes on, minimum of 30 minutes off) You can alternate Tylenol and Ibuprofen for pain management but Ibuprofen is typically preferred to reduce inflammation.  I have sent in a script for meloxicam for you to take once per day instead of NSAIDs like Ibuprofen. Take the meloxicam with food and plenty of water. Do not use other NSAIDs while using this (ibuprofen, motrin, advil, aleve)  You can also use Lidocaine patches (one per 24 hours)  Gentle stretches and exercises that I have included in your paperwork Try to reduce excess strain to the area and rest as much as possible  Wear supportive shoes and, if you must lift anything, use proper lifting techniques that spare your back.   If these measures do not lead to improvement in your symptoms over the next 2-4 weeks please let us know

## 2023-05-26 NOTE — Progress Notes (Unsigned)
Acute Office Visit   Patient: Teresa Wise   DOB: Sep 11, 1975   47 y.o. Female  MRN: [id number] Visit Date: 05/26/2023  Today's healthcare provider: Oswaldo Conroy Suellyn Meenan, PA-C  Introduced myself to the patient as a Secondary school teacher and provided education on APPs in clinical practice.    Chief Complaint  Patient presents with   Back Pain    On going x years, recently worst for the past few weeks right at tail bone and radiates bilateral   Subjective    HPI HPI     Back Pain    Additional comments: On going x years, recently worst for the past few weeks right at tail bone and radiates bilateral      Last edited by Forde Radon, CMA on 05/26/2023  2:26 PM.       Back pain    Onset: gradual  Duration: Current flare has been ongoing since 05/21/23 when she was riding in a car for prolonged period  Reports this has been ongoing since 2012 with intermittent flares  She reports that she is worried about it getting worse and her back "going out"  Location: above the tailbone  Radiation: Radiates from midline and sometimes down legs She reports if she arches her back to stretch her legs will go numb Pain level and character: currently 2/10 but can get up to 10/10  Other associated symptoms: reports intermittent numbness in legs,  She denies incontinence and saddle anesthesia, fever, chills  Interventions: Tylenol and Ibuprofen Alleviating: Ice , staying mobile and moving  Aggravating: sitting for prolonged periods, laying down for too long, or standing for prolonged periods   Reviewed CT abdomen/ pelvis from 01/16/2021 - no significant MSK findings per report  Most recent cervical spine DG (08/06/2022) shows mild degenerative cervical spine changes   Medications: Outpatient Medications Prior to Visit  Medication Sig   ALPRAZolam (XANAX XR) 0.5 MG 24 hr tablet Take 0.5 mg by mouth in the morning, at noon, and at bedtime.   busPIRone (BUSPAR) 10 MG tablet TAKE 2 TABLETS BY  MOUTH 3 TIMES DAILY.   fluconazole (DIFLUCAN) 150 MG tablet Take 1 tablet (150 mg total) by mouth every 3 (three) days as needed (for vaginal itching/yeast infection sx).   propranolol (INDERAL) 10 MG tablet Take 20 mg by mouth as needed (Tachycardia).   sertraline (ZOLOFT) 50 MG tablet TAKE 1 TABLET BY MOUTH EVERY DAY   No facility-administered medications prior to visit.    Review of Systems  Musculoskeletal:  Positive for back pain.    {Insert previous labs (optional):23779} {See past labs  Heme  Chem  Endocrine  Serology  Results Review (optional):1}   Objective    BP 102/66   Pulse (!) 102   Temp 98 F (36.7 C) (Oral)   Resp 16   Ht 5' (1.524 m)   Wt 178 lb 4.8 oz (80.9 kg)   SpO2 96%   BMI 34.82 kg/m  {Insert last BP/Wt (optional):23777}{See vitals history (optional):1}   Physical Exam Vitals reviewed.  Constitutional:      General: She is awake.     Appearance: Normal appearance. She is well-developed and well-groomed.  HENT:     Head: Normocephalic and atraumatic.  Musculoskeletal:     Cervical back: Normal range of motion.       Back:     Comments: Pain with lumbar extension and hip extension  Mild discomfort with thoracic lateral rotation  ROM  is symmetrical and normal with regards to lumbar flexion, thoracic lateral rotation and flexion; hip flexion, extension, abduction, adduction  Negative log roll testing bilaterally Strength 5/5 with hip flexion    Neurological:     Mental Status: She is alert.  Psychiatric:        Behavior: Behavior is cooperative.       No results found for any visits on 05/26/23.  Assessment & Plan      No follow-ups on file.      Problem List Items Addressed This Visit   None Visit Diagnoses     Chronic midline low back pain, unspecified whether sciatica present    -  Primary   Relevant Orders   DG Lumbar Spine Complete   DG Sacrum/Coccyx        No follow-ups on file.   I, Lamerle Jabs E Eliyas Suddreth, PA-C, have  reviewed all documentation for this visit. The documentation on 05/26/23 for the exam, diagnosis, procedures, and orders are all accurate and complete.   Jacquelin Hawking, MHS, PA-C [facility]

## 2023-06-15 NOTE — Progress Notes (Signed)
Your imaging results are back It does not appear that there is any osseous injury, fracture or dislocation to your lower back or sacrum/coccyx (tailbone area) at this time.  Your lower back looks normal without disc degeneration or arthritis present

## 2023-07-03 ENCOUNTER — Emergency Department
Admission: EM | Admit: 2023-07-03 | Discharge: 2023-07-03 | Discharge status: Against Medical Advice | Payer: 59 | Attending: Emergency Medicine | Admitting: Emergency Medicine

## 2023-07-03 ENCOUNTER — Other Ambulatory Visit: Payer: Self-pay

## 2023-07-03 DIAGNOSIS — R102 Pelvic and perineal pain: Secondary | ICD-10-CM | POA: Diagnosis present

## 2023-07-03 DIAGNOSIS — R110 Nausea: Secondary | ICD-10-CM | POA: Insufficient documentation

## 2023-07-03 DIAGNOSIS — Z5321 Procedure and treatment not carried out due to patient leaving prior to being seen by health care provider: Secondary | ICD-10-CM | POA: Insufficient documentation

## 2023-07-03 DIAGNOSIS — N939 Abnormal uterine and vaginal bleeding, unspecified: Secondary | ICD-10-CM | POA: Diagnosis not present

## 2023-07-03 LAB — CBC
HCT: 42.2 % (ref 36.0–46.0)
Hemoglobin: 13.8 g/dL (ref 12.0–15.0)
MCH: 29.2 pg (ref 26.0–34.0)
MCHC: 32.7 g/dL (ref 30.0–36.0)
MCV: 89.2 fL (ref 80.0–100.0)
Platelets: 366 10*3/uL (ref 150–400)
RBC: 4.73 MIL/uL (ref 3.87–5.11)
RDW: 13.6 % (ref 11.5–15.5)
WBC: 8.4 10*3/uL (ref 4.0–10.5)
nRBC: 0 % (ref 0.0–0.2)

## 2023-07-03 LAB — BASIC METABOLIC PANEL
Anion gap: 8 (ref 5–15)
BUN: 16 mg/dL (ref 6–20)
CO2: 25 mmol/L (ref 22–32)
Calcium: 8.9 mg/dL (ref 8.9–10.3)
Chloride: 105 mmol/L (ref 98–111)
Creatinine, Ser: 0.79 mg/dL (ref 0.44–1.00)
GFR, Estimated: >60 mL/min (ref >60)
Glucose, Bld: 105 mg/dL — ABNORMAL HIGH (ref 70–99)
Potassium: 3.8 mmol/L (ref 3.5–5.1)
Sodium: 138 mmol/L (ref 135–145)

## 2023-07-03 NOTE — ED Triage Notes (Addendum)
Pt to ED via POV from home. Pt reports hx of rupture cysts with similar pain that doubled her over this morning. Pt reports nausea. Pt hx of gallbladder and appendix removed and kidney stones. Pt had tramadol with no relief. Pt also reports light spotting that started today.

## 2023-07-03 NOTE — ED Notes (Signed)
Patient to desk requesting to leave "so she can go somewhere else." RN encouraged pt to stay to complete treatment without effect. IV removed. Pt witnessed ambulating from department with independent and steady gait. No distress noted.

## 2023-07-06 ENCOUNTER — Ambulatory Visit
Admission: RE | Admit: 2023-07-06 | Discharge: 2023-07-06 | Disposition: A | Discharge status: Home / Self Care | Payer: 59 | Source: Ambulatory Visit | Attending: Internal Medicine | Admitting: Internal Medicine

## 2023-07-06 ENCOUNTER — Ambulatory Visit: Payer: 59 | Admitting: Internal Medicine

## 2023-07-06 ENCOUNTER — Encounter: Payer: Self-pay | Admitting: Internal Medicine

## 2023-07-06 VITALS — BP 126/78 | HR 101 | Temp 98.3°F | Resp 18 | Ht 60.0 in | Wt 179.3 lb

## 2023-07-06 DIAGNOSIS — R1031 Right lower quadrant pain: Secondary | ICD-10-CM | POA: Insufficient documentation

## 2023-07-06 LAB — POCT URINALYSIS DIPSTICK
Appearance: NORMAL
Bilirubin, UA: NEGATIVE
Glucose, UA: NEGATIVE
Ketones, UA: NEGATIVE
Leukocytes, UA: NEGATIVE
Nitrite, UA: NEGATIVE
Protein, UA: NEGATIVE
Spec Grav, UA: 1.01 (ref 1.010–1.025)
Urobilinogen, UA: 0.2 U/dL
pH, UA: 7.5 (ref 5.0–8.0)

## 2023-07-06 MED ORDER — KETOROLAC TROMETHAMINE 60 MG/2ML IM SOLN
60.0000 mg | Freq: Once | INTRAMUSCULAR | Status: AC
  Administered 2023-07-06: 60 mg via INTRAMUSCULAR

## 2023-07-06 NOTE — Progress Notes (Signed)
Acute Office Visit  Subjective:     Patient ID: Teresa Wise, female    DOB: Sep 04, 1975, 47 y.o.   MRN: [id number]  Chief Complaint  Patient presents with   Dysmenorrhea    HPI Patient is in today for severe menstrual cramps. Started vaginal bleeding 3 days ago but RLQ pain started 1 week ago. Will come and go every hour but is severe and worsening when it does come on. Nothing triggers the pain but with it she becomes diaphoretic, nauseous and has a decreased appetite. Had a regular period last month but skipped in September which is unusual. Took a home pregnancy test which was negative. Had both fallopian tubes surgically removed as well as her appendix and gallbladder. Went to the ER for this and had labs but something happened and she was not evaluated. BMP and CBC normal. History of dermoid cyst removed on the right ovary in 2012.    Review of Systems  Constitutional:  Negative for chills and fever.  Gastrointestinal:  Positive for abdominal pain and nausea. Negative for blood in stool, constipation, diarrhea, melena and vomiting.  Genitourinary:  Positive for urgency. Negative for dysuria, flank pain, frequency and hematuria.        Objective:    BP 126/78   Pulse (!) 101   Temp 98.3 F (36.8 C)   Resp 18   Ht 5' (1.524 m)   Wt 179 lb 4.8 oz (81.3 kg)   LMP 07/03/2023   SpO2 98%   BMI 35.02 kg/m  BP Readings from Last 3 Encounters:  07/06/23 126/78  05/26/23 102/66  03/30/23 111/82   Wt Readings from Last 3 Encounters:  07/06/23 179 lb 4.8 oz (81.3 kg)  05/26/23 178 lb 4.8 oz (80.9 kg)  03/30/23 171 lb 3.2 oz (77.7 kg)      Physical Exam Constitutional:      Appearance: Normal appearance.  HENT:     Head: Normocephalic and atraumatic.  Eyes:     Conjunctiva/sclera: Conjunctivae normal.  Cardiovascular:     Rate and Rhythm: Normal rate and regular rhythm.  Pulmonary:     Effort: Pulmonary effort is normal.     Breath sounds: Normal breath sounds.   Abdominal:     General: Bowel sounds are normal. There is no distension.     Palpations: Abdomen is soft. There is no mass.     Tenderness: There is abdominal tenderness. There is no right CVA tenderness, left CVA tenderness, guarding or rebound.     Hernia: No hernia is present.     Comments: RLQ pain to palpation   Skin:    General: Skin is warm and dry.  Neurological:     General: No focal deficit present.     Mental Status: She is alert. Mental status is at baseline.  Psychiatric:        Mood and Affect: Mood normal.        Behavior: Behavior normal.     No results found for any visits on 07/06/23.      Assessment & Plan:   1. Pain, abdominal, RLQ: Home pregnancy test negative, fallopian tubes, appendix removed. Concern for ovarian torsion, will order a stat US today. Obtain urine sample today as well and will treat acute pain with Toradol injection.   - US PELVIC COMPLETE W TRANSVAGINAL AND TORSION R/O; Future - POCT Urinalysis Dipstick - ketorolac (TORADOL) injection 60 mg  Return if symptoms worsen or fail to improve.  Margarita Mail,  DO

## 2023-07-29 DIAGNOSIS — F331 Major depressive disorder, recurrent, moderate: Secondary | ICD-10-CM | POA: Diagnosis not present

## 2023-07-29 DIAGNOSIS — F410 Panic disorder [episodic paroxysmal anxiety] without agoraphobia: Secondary | ICD-10-CM | POA: Diagnosis not present

## 2023-07-29 DIAGNOSIS — F411 Generalized anxiety disorder: Secondary | ICD-10-CM | POA: Diagnosis not present

## 2023-08-05 NOTE — Progress Notes (Unsigned)
   Established Patient Office Visit  Subjective   Patient ID: Teresa Wise, female    DOB: 1976-04-26  Age: 47 y.o. MRN: [id number]  No chief complaint on file.   HPI  MDD: -Following with Psychiatry now at [facility] (had been [facility]) -Mood status: stable -Current treatment: Zoloft increased to 75 mg but still at 50 mg due to heartburn, Buspar 20 mg TID, Propanolol 10 mg just as needed - takes about every other day. Xanax 0.5 mg TID but weaning down -Satisfied with current treatment?: yes -Duration of current treatment : Zoloft is new, had been on Prozac previously  -Side effects: no Medication compliance: excellent compliance Psychotherapy/counseling: yes current     02/04/2023    9:08 AM 12/22/2022   10:47 AM 10/29/2022    9:37 AM 08/06/2022    9:42 AM  Depression screen PHQ 2/9  Decreased Interest 0 0 0 0  Down, Depressed, Hopeless 0 0 0 0  PHQ - 2 Score 0 0 0 0  Altered sleeping 0 0 0 0  Tired, decreased energy 0 0 0 0  Change in appetite 0 0 0 0  Feeling bad or failure about yourself  0 0 0 0  Trouble concentrating 0 0 0 0  Moving slowly or fidgety/restless 0 0 0 0  Suicidal thoughts 0 0 0 0  PHQ-9 Score 0 0 0 0  Difficult doing work/chores Not difficult at all Not difficult at all Not difficult at all     Health Maintenance: -Blood work UTD -Mammogram 5/24, Birads-1 -Pap 4/23 negative -Colon cancer screening due   {History (Optional):23778}  ROS    Objective:     There were no vitals taken for this visit. {Vitals History (Optional):23777}  Physical Exam   No results found for any visits on 08/06/23.  {Labs (Optional):23779}  The 10-year ASCVD risk score (Arnett DK, et al., 2019) is: 3.9%    Assessment & Plan:  There are no diagnoses linked to this encounter.   No follow-ups on file.    Margarita Mail, DO

## 2023-08-06 ENCOUNTER — Encounter: Payer: Self-pay | Admitting: Internal Medicine

## 2023-08-06 ENCOUNTER — Other Ambulatory Visit: Payer: Self-pay

## 2023-08-06 ENCOUNTER — Ambulatory Visit: Payer: 59 | Admitting: Internal Medicine

## 2023-08-06 ENCOUNTER — Other Ambulatory Visit: Payer: Self-pay | Admitting: Internal Medicine

## 2023-08-06 VITALS — BP 118/72 | HR 98 | Temp 98.3°F | Resp 16 | Ht 60.0 in | Wt 179.8 lb

## 2023-08-06 DIAGNOSIS — E782 Mixed hyperlipidemia: Secondary | ICD-10-CM

## 2023-08-06 DIAGNOSIS — F419 Anxiety disorder, unspecified: Secondary | ICD-10-CM

## 2023-08-06 DIAGNOSIS — Z6838 Body mass index (BMI) 38.0-38.9, adult: Secondary | ICD-10-CM | POA: Diagnosis not present

## 2023-08-06 DIAGNOSIS — E66812 Obesity, class 2: Secondary | ICD-10-CM

## 2023-08-06 MED ORDER — WEGOVY 0.25 MG/0.5ML ~~LOC~~ SOAJ: 0.2500 mg | SUBCUTANEOUS | 0 refills | Status: AC

## 2023-08-06 NOTE — Assessment & Plan Note (Signed)
Stable, following with [facility], no changes to medications.

## 2023-08-06 NOTE — Assessment & Plan Note (Signed)
-

## 2023-08-06 NOTE — Telephone Encounter (Signed)
Requested medication (s) are due for refill today:   Alternative being requested.   Not covered by insurance  Requested medication (s) are on the active medication list:   N/A  Future visit scheduled:   No    Seen today 12/12 by Dr. Caralee Ates   Last ordered: Today 12/12.   Not covered by insurance.   Alternative being requested.     Requested Prescriptions  Pending Prescriptions Disp Refills   WEGOVY 0.25 MG/0.5ML SOAJ [Pharmacy Med Name: WEGOVY 0.25 MG/0.5 ML PEN]  0    Sig: INJECT 0.25MG  INTO THE SKIN ONE TIME PER WEEK     Endocrinology:  Diabetes - GLP-1 Receptor Agonists - semaglutide Failed - 08/06/2023 12:38 PM      Failed - HBA1C in normal range and within 180 days    No results found for: "HGBA1C", "LABA1C"       Passed - Cr in normal range and within 360 days    Creat  Date Value Ref Range Status  08/06/2022 0.90 0.50 - 0.99 mg/dL Final   Creatinine, Ser  Date Value Ref Range Status  07/03/2023 0.79 0.44 - 1.00 mg/dL Final         Passed - Valid encounter within last 6 months    Recent Outpatient Visits           Today Class 2 severe obesity with serious comorbidity and body mass index (BMI) of 38.0 to 38.9 in adult, unspecified obesity type [facility])   [facility] Margarita Mail, DO   1 month ago Pain, abdominal, RLQ   [facility] Margarita Mail, DO   2 months ago Chronic midline low back pain, unspecified whether sciatica present   [facility] M[facility]um, Oswaldo Conroy, PA-C   2 months ago Vaginal discharge   [facility] Danelle Berry, PA-C   6 months ago Severe recurrent major depression without psychotic features [facility])   [facility] Margarita Mail, [place]

## 2023-08-07 LAB — COMPLETE METABOLIC PANEL WITH GFR
AG Ratio: 1.7 (calc) (ref 1.0–2.5)
ALT: 18 U/L (ref 6–29)
AST: 13 U/L (ref 10–35)
Albumin: 4 g/dL (ref 3.6–5.1)
Alkaline phosphatase (APISO): 58 U/L (ref 31–125)
BUN: 15 mg/dL (ref 7–25)
CO2: 27 mmol/L (ref 20–32)
Calcium: 9.5 mg/dL (ref 8.6–10.2)
Chloride: 106 mmol/L (ref 98–110)
Creat: 0.8 mg/dL (ref 0.50–0.99)
Globulin: 2.3 g/dL (ref 1.9–3.7)
Glucose, Bld: 83 mg/dL (ref 65–99)
Potassium: 4.5 mmol/L (ref 3.5–5.3)
Sodium: 140 mmol/L (ref 135–146)
Total Bilirubin: 0.2 mg/dL (ref 0.2–1.2)
Total Protein: 6.3 g/dL (ref 6.1–8.1)
eGFR: 93 mL/min/{1.73_m2} (ref >=60)

## 2023-08-07 LAB — LIPID PANEL
Cholesterol: 242 mg/dL — ABNORMAL HIGH (ref <200)
HDL: 47 mg/dL — ABNORMAL LOW (ref >=50)
LDL Cholesterol (Calc): 136 mg/dL — ABNORMAL HIGH
Non-HDL Cholesterol (Calc): 195 mg/dL — ABNORMAL HIGH (ref <130)
Total CHOL/HDL Ratio: 5.1 (calc) — ABNORMAL HIGH (ref <5.0)
Triglycerides: 381 mg/dL — ABNORMAL HIGH (ref <150)

## 2023-09-23 DIAGNOSIS — F410 Panic disorder [episodic paroxysmal anxiety] without agoraphobia: Secondary | ICD-10-CM | POA: Diagnosis not present

## 2023-09-23 DIAGNOSIS — F411 Generalized anxiety disorder: Secondary | ICD-10-CM | POA: Diagnosis not present

## 2023-09-23 DIAGNOSIS — F331 Major depressive disorder, recurrent, moderate: Secondary | ICD-10-CM | POA: Diagnosis not present

## 2023-10-07 DIAGNOSIS — R208 Other disturbances of skin sensation: Secondary | ICD-10-CM | POA: Diagnosis not present

## 2023-10-07 DIAGNOSIS — L821 Other seborrheic keratosis: Secondary | ICD-10-CM | POA: Diagnosis not present

## 2023-10-07 DIAGNOSIS — L578 Other skin changes due to chronic exposure to nonionizing radiation: Secondary | ICD-10-CM | POA: Diagnosis not present

## 2023-10-07 DIAGNOSIS — D235 Other benign neoplasm of skin of trunk: Secondary | ICD-10-CM | POA: Diagnosis not present

## 2023-10-07 DIAGNOSIS — A630 Anogenital (venereal) warts: Secondary | ICD-10-CM | POA: Diagnosis not present

## 2023-10-07 DIAGNOSIS — L538 Other specified erythematous conditions: Secondary | ICD-10-CM | POA: Diagnosis not present

## 2023-12-12 DIAGNOSIS — B349 Viral infection, unspecified: Secondary | ICD-10-CM | POA: Diagnosis not present

## 2023-12-12 DIAGNOSIS — R197 Diarrhea, unspecified: Secondary | ICD-10-CM | POA: Diagnosis not present

## 2023-12-23 DIAGNOSIS — F411 Generalized anxiety disorder: Secondary | ICD-10-CM | POA: Diagnosis not present

## 2023-12-23 DIAGNOSIS — F410 Panic disorder [episodic paroxysmal anxiety] without agoraphobia: Secondary | ICD-10-CM | POA: Diagnosis not present

## 2023-12-23 DIAGNOSIS — F331 Major depressive disorder, recurrent, moderate: Secondary | ICD-10-CM | POA: Diagnosis not present

## 2024-02-19 DIAGNOSIS — R300 Dysuria: Secondary | ICD-10-CM | POA: Diagnosis not present

## 2024-02-19 DIAGNOSIS — N390 Urinary tract infection, site not specified: Secondary | ICD-10-CM | POA: Diagnosis not present

## 2024-03-07 ENCOUNTER — Ambulatory Visit: Admitting: Nurse Practitioner

## 2024-03-07 ENCOUNTER — Encounter: Payer: Self-pay | Admitting: Nurse Practitioner

## 2024-03-07 VITALS — BP 116/68 | HR 93 | Temp 98.2°F | Resp 18 | Ht 60.0 in | Wt 181.2 lb

## 2024-03-07 DIAGNOSIS — G4485 Primary stabbing headache: Secondary | ICD-10-CM

## 2024-03-07 DIAGNOSIS — R200 Anesthesia of skin: Secondary | ICD-10-CM

## 2024-03-07 DIAGNOSIS — R5383 Other fatigue: Secondary | ICD-10-CM | POA: Diagnosis not present

## 2024-03-07 DIAGNOSIS — R519 Headache, unspecified: Secondary | ICD-10-CM | POA: Diagnosis not present

## 2024-03-07 DIAGNOSIS — M7989 Other specified soft tissue disorders: Secondary | ICD-10-CM | POA: Diagnosis not present

## 2024-03-07 DIAGNOSIS — R202 Paresthesia of skin: Secondary | ICD-10-CM

## 2024-03-07 DIAGNOSIS — M0579 Rheumatoid arthritis with rheumatoid factor of multiple sites without organ or systems involvement: Secondary | ICD-10-CM | POA: Diagnosis not present

## 2024-03-07 MED ORDER — SUMATRIPTAN SUCCINATE 25 MG PO TABS: 50.0000 mg | ORAL_TABLET | ORAL | 0 refills | Status: AC | PRN

## 2024-03-07 MED ORDER — KETOROLAC TROMETHAMINE 60 MG/2ML IM SOLN
60.0000 mg | Freq: Once | INTRAMUSCULAR | Status: AC
  Administered 2024-03-07: 60 mg via INTRAMUSCULAR

## 2024-03-07 MED ORDER — PROMETHAZINE HCL 50 MG/ML IJ SOLN
25.0000 mg | Freq: Once | INTRAMUSCULAR | Status: AC
  Administered 2024-03-07: 25 mg via INTRAMUSCULAR

## 2024-03-07 NOTE — Progress Notes (Signed)
 BP 116/68   Pulse 93   Temp 98.2 F (36.8 C)   Resp 18   Ht 5' (1.524 m)   Wt 181 lb 3.2 oz (82.2 kg)   LMP 03/06/2024   SpO2 96%   BMI 35.39 kg/m    Subjective:    Patient ID: Teresa Wise, female    DOB: September 17, 1975, 48 y.o.   MRN: [id number]  HPI: Teresa Wise is a 48 y.o. female  Chief Complaint  Patient presents with   Eye Twitching    Left side causing headache onset 2 weeks    Discussed the use of AI scribe software for clinical note transcription with the patient, who gave verbal consent to proceed.  History of Present Illness Teresa Wise is a 48 year old female who presents with a two-week history of left eyelid twitching and headaches.  She has been experiencing twitching of her left eyelid for almost two weeks, accompanied by headaches characterized by pressure and sharp pains, especially when coughing or sneezing. She has tried ibuprofen , bc powder, tylenol  with no relief. She denies auras associated with the headaches.  She reports numbness and tingling in her hands and feet for several months, describing it as 'going on for a while.' Additionally, she has swelling in her feet and ankles, more pronounced on the left side, which has been present for about nine months. She feels extremely fatigued, stating that 'the simplest task is to wipe me out.'  She had lab work done a couple of years ago, but no recent imaging of her brain has been performed.         08/06/2023    9:28 AM 07/06/2023   11:35 AM 05/26/2023    2:19 PM  Depression screen PHQ 2/9  Decreased Interest 0 0 0  Down, Depressed, Hopeless 0 0 0  PHQ - 2 Score 0 0 0  Altered sleeping 0 0 0  Tired, decreased energy 0 0 0  Change in appetite 0 0 0  Feeling bad or failure about yourself  0 0 0  Trouble concentrating 0 0 0  Moving slowly or fidgety/restless 0 0 0  Suicidal thoughts 0 0 0  PHQ-9 Score 0 0 0  Difficult doing work/chores Not difficult at all Not difficult at all Not difficult at  all    Relevant past medical, surgical, family and social history reviewed and updated as indicated. Interim medical history since our last visit reviewed. Allergies and medications reviewed and updated.  Review of Systems  Ten systems reviewed and is negative except as mentioned in HPI      Objective:     BP 116/68   Pulse 93   Temp 98.2 F (36.8 C)   Resp 18   Ht 5' (1.524 m)   Wt 181 lb 3.2 oz (82.2 kg)   LMP 03/06/2024   SpO2 96%   BMI 35.39 kg/m    Wt Readings from Last 3 Encounters:  03/07/24 181 lb 3.2 oz (82.2 kg)  08/06/23 179 lb 12.8 oz (81.6 kg)  07/06/23 179 lb 4.8 oz (81.3 kg)    Physical Exam Physical Exam GENERAL: Alert, cooperative, well developed, no acute distress HEENT: Normocephalic, normal oropharynx, moist mucous membranes CHEST: Clear to auscultation bilaterally, no wheezes, rhonchi, or crackles CARDIOVASCULAR: Normal heart rate and rhythm, S1 and S2 normal without murmurs ABDOMEN: Soft, non-tender, non-distended, without organomegaly, normal bowel sounds EXTREMITIES: No cyanosis or edema NEUROLOGICAL: Cranial nerves grossly intact, moves all extremities without gross motor  or sensory deficit, neurological exam normal, proprioception normal   Results for orders placed or performed in visit on 08/06/23  COMPLETE METABOLIC PANEL WITH GFR   Collection Time: 08/06/23  9:57 AM  Result Value Ref Range   Glucose, Bld 83 65 - 99 mg/dL   BUN 15 7 - 25 mg/dL   Creat 9.19 9.49 - 9.00 mg/dL   eGFR 93 > OR = 60 fO/fpw/8.26f7   BUN/Creatinine Ratio SEE NOTE: 6 - 22 (calc)   Sodium 140 135 - 146 mmol/L   Potassium 4.5 3.5 - 5.3 mmol/L   Chloride 106 98 - 110 mmol/L   CO2 27 20 - 32 mmol/L   Calcium 9.5 8.6 - 10.2 mg/dL   Total Protein 6.3 6.1 - 8.1 g/dL   Albumin 4.0 3.6 - 5.1 g/dL   Globulin 2.3 1.9 - 3.7 g/dL (calc)   AG Ratio 1.7 1.0 - 2.5 (calc)   Total Bilirubin 0.2 0.2 - 1.2 mg/dL   Alkaline phosphatase (APISO) 58 31 - 125 U/L   AST 13 10 - 35  U/L   ALT 18 6 - 29 U/L  Lipid Profile   Collection Time: 08/06/23  9:57 AM  Result Value Ref Range   Cholesterol 242 (H) <200 mg/dL   HDL 47 (L) > OR = 50 mg/dL   Triglycerides 618 (H) <150 mg/dL   LDL Cholesterol (Calc) 136 (H) mg/dL (calc)   Total CHOL/HDL Ratio 5.1 (H) <5.0 (calc)   Non-HDL Cholesterol (Calc) 195 (H) <130 mg/dL (calc)          Assessment & Plan:   Problem List Items Addressed This Visit   None Visit Diagnoses       Acute nonintractable headache, unspecified headache type    -  Primary   Relevant Medications   SUMAtriptan  (IMITREX ) 25 MG tablet   ketorolac  (TORADOL ) injection 60 mg   promethazine  (PHENERGAN ) injection 25 mg   Other Relevant Orders   CBC with Differential/Platelet   Comprehensive metabolic panel with GFR   TSH   B12 and Folate Panel   VITAMIN D  25 Hydroxy (Vit-D Deficiency, Fractures)   Analyzer ANA IFA w/RFLX Titer/Pattern,Systemic Autoimmune Panel 1   MR Brain Wo Contrast   Sed Rate (ESR)   C-reactive protein     Other fatigue       Relevant Orders   CBC with Differential/Platelet   Comprehensive metabolic panel with GFR   TSH   B12 and Folate Panel   VITAMIN D  25 Hydroxy (Vit-D Deficiency, Fractures)   Analyzer ANA IFA w/RFLX Titer/Pattern,Systemic Autoimmune Panel 1   MR Brain Wo Contrast   Sed Rate (ESR)   C-reactive protein     Localized swelling of lower extremity       Relevant Orders   CBC with Differential/Platelet   Comprehensive metabolic panel with GFR   TSH   B12 and Folate Panel   VITAMIN D  25 Hydroxy (Vit-D Deficiency, Fractures)   Analyzer ANA IFA w/RFLX Titer/Pattern,Systemic Autoimmune Panel 1   Sed Rate (ESR)   C-reactive protein     Numbness and tingling in both hands       Relevant Orders   CBC with Differential/Platelet   Comprehensive metabolic panel with GFR   TSH   B12 and Folate Panel   VITAMIN D  25 Hydroxy (Vit-D Deficiency, Fractures)   Analyzer ANA IFA w/RFLX Titer/Pattern,Systemic  Autoimmune Panel 1   MR Brain Wo Contrast   Sed Rate (ESR)   C-reactive protein  Numbness and tingling of both feet       Relevant Orders   CBC with Differential/Platelet   Comprehensive metabolic panel with GFR   TSH   B12 and Folate Panel   VITAMIN D  25 Hydroxy (Vit-D Deficiency, Fractures)   Analyzer ANA IFA w/RFLX Titer/Pattern,Systemic Autoimmune Panel 1   MR Brain Wo Contrast   Sed Rate (ESR)   C-reactive protein     Primary stabbing headache       Relevant Medications   SUMAtriptan  (IMITREX ) 25 MG tablet   ketorolac  (TORADOL ) injection 60 mg        Assessment and Plan Assessment & Plan Numbness and tingling bilateral hands and feet Chronic numbness and tingling in hands and feet for approximately nine months. Differential diagnosis includes other neurological or autoimmune disorders. No prior imaging or recent lab work available for review. - Order MRI of the brain to rule out underlying neurological conditions. - Order autoimmune panel to investigate potential autoimmune disorders.  Lower extremity swelling Swelling in ankles, more pronounced on the left side, ongoing for several months. Possible association with systemic issues given concurrent symptoms of numbness and tingling.  Headache with associated fatigue, paresthesia, and localized edema Intermittent headaches for two weeks with associated fatigue, paresthesia, and localized edema. Headaches described as pressure and sharp pains, exacerbated by coughing or sneezing. No aura or vision changes reported. Normal neurological exam reduces likelihood of acute intracranial pathology. - Administer Toradol  and Phenergan  injection for acute headache relief. - Prescribe Imitrex  for home use with instructions to take one dose and repeat in two hours if needed, not exceeding two doses per day. - Order MRI of the brain to rule out underlying causes of headache. - Order laboratory tests to assess for underlying  conditions.  Left eyelid twitching Left eyelid twitching for almost two weeks.        Follow up plan: Return if symptoms worsen or fail to improve.

## 2024-03-08 ENCOUNTER — Ambulatory Visit: Payer: Self-pay | Admitting: Nurse Practitioner

## 2024-03-08 DIAGNOSIS — R5383 Other fatigue: Secondary | ICD-10-CM

## 2024-03-08 DIAGNOSIS — R768 Other specified abnormal immunological findings in serum: Secondary | ICD-10-CM

## 2024-03-08 DIAGNOSIS — R519 Headache, unspecified: Secondary | ICD-10-CM

## 2024-03-10 ENCOUNTER — Ambulatory Visit

## 2024-03-16 ENCOUNTER — Ambulatory Visit
Admission: RE | Admit: 2024-03-16 | Discharge: 2024-03-16 | Disposition: A | Discharge status: Home / Self Care | Source: Ambulatory Visit | Attending: Nurse Practitioner | Admitting: Nurse Practitioner

## 2024-03-16 DIAGNOSIS — R202 Paresthesia of skin: Secondary | ICD-10-CM | POA: Diagnosis not present

## 2024-03-16 DIAGNOSIS — R519 Headache, unspecified: Secondary | ICD-10-CM | POA: Diagnosis not present

## 2024-03-16 DIAGNOSIS — R200 Anesthesia of skin: Secondary | ICD-10-CM | POA: Insufficient documentation

## 2024-03-16 DIAGNOSIS — R5383 Other fatigue: Secondary | ICD-10-CM | POA: Insufficient documentation

## 2024-03-19 LAB — COMPREHENSIVE METABOLIC PANEL WITH GFR
AG Ratio: 1.7 (calc) (ref 1.0–2.5)
ALT: 15 U/L (ref 6–29)
AST: 14 U/L (ref 10–35)
Albumin: 4 g/dL (ref 3.6–5.1)
Alkaline phosphatase (APISO): 56 U/L (ref 31–125)
BUN: 13 mg/dL (ref 7–25)
CO2: 27 mmol/L (ref 20–32)
Calcium: 9.2 mg/dL (ref 8.6–10.2)
Chloride: 108 mmol/L (ref 98–110)
Creat: 0.72 mg/dL (ref 0.50–0.99)
Globulin: 2.4 g/dL (ref 1.9–3.7)
Glucose, Bld: 71 mg/dL (ref 65–99)
Potassium: 4.3 mmol/L (ref 3.5–5.3)
Sodium: 141 mmol/L (ref 135–146)
Total Bilirubin: 0.2 mg/dL (ref 0.2–1.2)
Total Protein: 6.4 g/dL (ref 6.1–8.1)
eGFR: 104 mL/min/1.73m2 (ref >=60)

## 2024-03-19 LAB — VITAMIN D 25 HYDROXY (VIT D DEFICIENCY, FRACTURES): Vit D, 25-Hydroxy: 32 ng/mL (ref 30–100)

## 2024-03-19 LAB — ANALYZER(R)ANA IFA WITH REFLEX TITER/PATTRN,SYS AUTOIMM PNL1
Anti Nuclear Antibody (ANA): POSITIVE — AB
Anticardiolipin IgA: <2 [APL'U]/mL
Anticardiolipin IgG: <2 [GPL'U]/mL
Anticardiolipin IgM: <2 [MPL'U]/mL
Beta-2 Glyco 1 IgA: <2 U/mL
Beta-2 Glyco 1 IgM: <2 U/mL
Beta-2 Glyco I IgG: <2 U/mL
C3 Complement: 136 mg/dL (ref 83–193)
C4 Complement: 21 mg/dL (ref 15–57)
Centromere Ab Screen: <1 AI
Chromatin (Nucleosomal) Antibody: <1 AI
Cyclic Citrullin Peptide Ab: <16 U
DNA Ab (DS) Crithidia, IFA: NEGATIVE
ENA SM Ab Ser-aCnc: <1 AI
Jo-1 Autoabs: <1 AI
MUTATED CITRULLINATED VIMENTIN (MCV) AB: <20 U/mL (ref <20)
Rheumatoid Factor (IgA): <5 U
Rheumatoid Factor (IgG): <5 U
Rheumatoid Factor (IgM): <5 U
Ribonucleic Protein(ENA) Antibody, IgG: <1 AI
SM/RNP: <1 AI
SSA (Ro) (ENA) Antibody, IgG: <1 AI
SSB (La) (ENA) Antibody, IgG: <1 AI
Scleroderma (Scl-70) (ENA) Antibody, IgG: <1 AI
Thyroperoxidase Ab SerPl-aCnc: 1 [IU]/mL (ref <9)

## 2024-03-19 LAB — CBC WITH DIFFERENTIAL/PLATELET
Absolute Lymphocytes: 2927 {cells}/uL (ref 850–3900)
Absolute Monocytes: 648 {cells}/uL (ref 200–950)
Basophils Absolute: 82 {cells}/uL (ref 0–200)
Basophils Relative: 1 %
Eosinophils Absolute: 180 {cells}/uL (ref 15–500)
Eosinophils Relative: 2.2 %
HCT: 42.7 % (ref 35.0–45.0)
Hemoglobin: 13.8 g/dL (ref 11.7–15.5)
MCH: 29 pg (ref 27.0–33.0)
MCHC: 32.3 g/dL (ref 32.0–36.0)
MCV: 89.7 fL (ref 80.0–100.0)
MPV: 10.6 fL (ref 7.5–12.5)
Monocytes Relative: 7.9 %
Neutro Abs: 4362 {cells}/uL (ref 1500–7800)
Neutrophils Relative %: 53.2 %
Platelets: 390 Thousand/uL (ref 140–400)
RBC: 4.76 Million/uL (ref 3.80–5.10)
RDW: 12.9 % (ref 11.0–15.0)
Total Lymphocyte: 35.7 %
WBC: 8.2 Thousand/uL (ref 3.8–10.8)

## 2024-03-19 LAB — B12 AND FOLATE PANEL
Folate: 9.3 ng/mL
Vitamin B-12: 289 pg/mL (ref 200–1100)

## 2024-03-19 LAB — C-REACTIVE PROTEIN: CRP: <3 mg/L (ref <8.0)

## 2024-03-19 LAB — SEDIMENTATION RATE: Sed Rate: 9 mm/h (ref 0–20)

## 2024-03-19 LAB — ANTI-NUCLEAR AB-TITER (ANA TITER): ANA Titer 1: 1:80 {titer} — ABNORMAL HIGH

## 2024-03-19 LAB — TSH: TSH: 1.17 m[IU]/L

## 2024-03-23 DIAGNOSIS — F410 Panic disorder [episodic paroxysmal anxiety] without agoraphobia: Secondary | ICD-10-CM | POA: Diagnosis not present

## 2024-03-23 DIAGNOSIS — F411 Generalized anxiety disorder: Secondary | ICD-10-CM | POA: Diagnosis not present

## 2024-03-23 DIAGNOSIS — F331 Major depressive disorder, recurrent, moderate: Secondary | ICD-10-CM | POA: Diagnosis not present

## 2024-03-24 DIAGNOSIS — Z1331 Encounter for screening for depression: Secondary | ICD-10-CM | POA: Diagnosis not present

## 2024-03-24 DIAGNOSIS — G939 Disorder of brain, unspecified: Secondary | ICD-10-CM | POA: Diagnosis not present

## 2024-03-24 DIAGNOSIS — R519 Headache, unspecified: Secondary | ICD-10-CM | POA: Diagnosis not present

## 2024-03-24 DIAGNOSIS — R253 Fasciculation: Secondary | ICD-10-CM | POA: Diagnosis not present

## 2024-03-24 DIAGNOSIS — R200 Anesthesia of skin: Secondary | ICD-10-CM | POA: Diagnosis not present

## 2024-03-24 DIAGNOSIS — R202 Paresthesia of skin: Secondary | ICD-10-CM | POA: Diagnosis not present

## 2024-03-31 ENCOUNTER — Other Ambulatory Visit: Payer: Self-pay | Admitting: Neurology

## 2024-03-31 DIAGNOSIS — R200 Anesthesia of skin: Secondary | ICD-10-CM

## 2024-04-07 ENCOUNTER — Ambulatory Visit
Admission: RE | Admit: 2024-04-07 | Discharge: 2024-04-07 | Disposition: A | Discharge status: Home / Self Care | Source: Ambulatory Visit | Attending: Neurology | Admitting: Neurology

## 2024-04-07 DIAGNOSIS — R202 Paresthesia of skin: Secondary | ICD-10-CM | POA: Insufficient documentation

## 2024-04-07 DIAGNOSIS — R200 Anesthesia of skin: Secondary | ICD-10-CM | POA: Diagnosis not present

## 2024-04-07 DIAGNOSIS — M79602 Pain in left arm: Secondary | ICD-10-CM | POA: Diagnosis not present

## 2024-04-07 DIAGNOSIS — M79601 Pain in right arm: Secondary | ICD-10-CM | POA: Diagnosis not present

## 2024-04-07 DIAGNOSIS — M2578 Osteophyte, vertebrae: Secondary | ICD-10-CM | POA: Diagnosis not present

## 2024-04-07 MED ORDER — GADOBUTROL 1 MMOL/ML IV SOLN
7.5000 mL | Freq: Once | INTRAVENOUS | Status: AC | PRN
  Administered 2024-04-07: 7.5 mL via INTRAVENOUS

## 2024-05-18 DIAGNOSIS — Z79899 Other long term (current) drug therapy: Secondary | ICD-10-CM | POA: Diagnosis not present

## 2024-05-18 DIAGNOSIS — R768 Other specified abnormal immunological findings in serum: Secondary | ICD-10-CM | POA: Diagnosis not present

## 2024-05-18 DIAGNOSIS — L4050 Arthropathic psoriasis, unspecified: Secondary | ICD-10-CM | POA: Diagnosis not present

## 2024-06-08 ENCOUNTER — Other Ambulatory Visit: Payer: Self-pay | Admitting: Internal Medicine

## 2024-06-08 DIAGNOSIS — Z1231 Encounter for screening mammogram for malignant neoplasm of breast: Secondary | ICD-10-CM

## 2024-06-13 DIAGNOSIS — F410 Panic disorder [episodic paroxysmal anxiety] without agoraphobia: Secondary | ICD-10-CM | POA: Diagnosis not present

## 2024-06-13 DIAGNOSIS — F331 Major depressive disorder, recurrent, moderate: Secondary | ICD-10-CM | POA: Diagnosis not present

## 2024-06-13 DIAGNOSIS — F411 Generalized anxiety disorder: Secondary | ICD-10-CM | POA: Diagnosis not present

## 2024-06-15 ENCOUNTER — Encounter

## 2024-06-15 DIAGNOSIS — Z1231 Encounter for screening mammogram for malignant neoplasm of breast: Secondary | ICD-10-CM

## 2024-07-04 DIAGNOSIS — L4050 Arthropathic psoriasis, unspecified: Secondary | ICD-10-CM | POA: Diagnosis not present

## 2024-07-04 DIAGNOSIS — Z79899 Other long term (current) drug therapy: Secondary | ICD-10-CM | POA: Diagnosis not present

## 2024-08-04 DIAGNOSIS — B001 Herpesviral vesicular dermatitis: Secondary | ICD-10-CM | POA: Diagnosis not present

## 2024-08-04 DIAGNOSIS — J069 Acute upper respiratory infection, unspecified: Secondary | ICD-10-CM | POA: Diagnosis not present

## 2024-08-19 ENCOUNTER — Telehealth: Admitting: Family Medicine

## 2024-08-19 DIAGNOSIS — B001 Herpesviral vesicular dermatitis: Secondary | ICD-10-CM

## 2024-08-19 MED ORDER — VALACYCLOVIR HCL 1 G PO TABS: 1000.0000 mg | ORAL_TABLET | Freq: Two times a day (BID) | ORAL | 0 refills | Status: AC

## 2024-08-19 NOTE — Patient Instructions (Signed)
 " Teresa Wise, thank you for joining [name], PA-C for today's virtual visit.  While this provider is not your primary care provider (PCP), if your PCP is located in our provider database this encounter information will be shared with them immediately following your visit.   A [facility] MyChart account gives you access to today's visit and all your visits, tests, and labs performed at [facility]  click here if you don't have a [facility] MyChart account or go to [email or web address]  Consent: (Patient) Teresa Wise provided verbal consent for this virtual visit at the beginning of the encounter.  Current Medications:  Current Outpatient Medications:    valACYclovir  (VALTREX ) 1000 MG tablet, Take 1 tablet (1,000 mg total) by mouth 2 (two) times daily for 7 days., Disp: 14 tablet, Rfl: 0   ALPRAZolam  (XANAX  XR) 0.5 MG 24 hr tablet, Take 0.5 mg by mouth in the morning, at noon, and at bedtime., Disp: , Rfl:    busPIRone  (BUSPAR ) 10 MG tablet, TAKE 2 TABLETS BY MOUTH 3 TIMES DAILY., Disp: 540 tablet, Rfl: 0   meloxicam  (MOBIC ) 15 MG tablet, Take 1 tablet (15 mg total) by mouth daily., Disp: 30 tablet, Rfl: 0   propranolol (INDERAL) 10 MG tablet, Take 20 mg by mouth as needed (Tachycardia)., Disp: , Rfl:    Semaglutide -Weight Management (WEGOVY ) 0.25 MG/0.5ML SOAJ, Inject 0.25 mg into the skin once a week., Disp: 2 mL, Rfl: 0   sertraline  (ZOLOFT ) 50 MG tablet, TAKE 1 TABLET BY MOUTH EVERY DAY, Disp: 90 tablet, Rfl: 0   SUMAtriptan  (IMITREX ) 25 MG tablet, Take 2 tablets (50 mg total) by mouth every 2 (two) hours as needed for migraine (ongoing headache). Maximum daily dose 200mg , Disp: 30 tablet, Rfl: 0   Medications ordered in this encounter:  Meds ordered this encounter  Medications   valACYclovir  (VALTREX ) 1000 MG tablet    Sig: Take 1 tablet (1,000 mg total) by mouth 2 (two) times daily for 7 days.    Dispense:  14 tablet    Refill:  0     *If you need refills  on other medications prior to your next appointment, please contact your pharmacy*  Follow-Up: Call back or seek an in-person evaluation if the symptoms worsen or if the condition fails to improve as anticipated.   Virtual Care [phone]  Other Instructions Cold Sore  A cold sore, also called a fever blister, is a small, fluid-filled sore that forms inside of the mouth or on the lips, gums, nose, chin, or cheeks. Cold sores can spread to other parts of the body, such as the eyes, fingers, or genitals. Cold sores can spread from person to person (are contagious) until the sores crust over completely. Most cold sores go away within 2 weeks. What are the causes? Cold sores are caused by a virus (herpes simplex virus type 1, HSV-1). The virus can spread from person to person through close contact, such as through: Kissing. Touching the affected area. Sharing personal items such as lip balm, razors, a drinking glass, or eating utensils. What increases the risk? Being tired, stressed, or sick. Having your period (menstruating). Being pregnant. Taking certain medicines. Being out in cold weather or getting too much sun. What are the signs or symptoms? Symptoms of a cold sore go through different stages: Tingling, itching, or burning is felt 1-2 days before the cold sore appears. Fluid-filled blisters appear on the lips, inside the mouth, on the nose, or on the  cheeks. The blisters start to ooze clear fluid. The blisters dry up, and a yellow crust appears in their place. The crust falls off. In some cases, other symptoms can develop along with cold sores. These can include: Fever. Sore throat. Headache. Muscle aches. Swollen neck glands. How is this treated? There is no cure for cold sores or the virus that causes them. There is also no vaccine to prevent the virus. Most cold sores go away on their own without treatment within 2 weeks. Your doctor may prescribe medicines  to: Help with pain. Keep the virus from growing. Help you heal faster. Medicines may be in the form of creams, gels, pills, or a shot. Follow these instructions at home: Medicines Take or apply over-the-counter and prescription medicines only as told by your doctor. Use a cotton-tip swab to apply creams or gels to your sores. Ask your doctor if you can take lysine supplements. These may help with healing. Sore care  Do not touch the sores or pick the scabs. Wash your hands often with soap and water  for at least 20 seconds. Do not touch your eyes without washing your hands first. Keep the sores clean and dry. If told, put ice on the sores. To do this: Put ice in a plastic bag. Place a towel between your skin and the bag. Leave the ice on for 20 minutes, 2-3 times a day. Take off the ice if your skin turns bright red. This is very important. If you cannot feel pain, heat, or cold, you have a greater risk of damage to the area. Eating and drinking Eat a soft, bland diet. Avoid eating hot, cold, or salty foods. These can hurt your mouth. Use a straw if it hurts to drink out of a glass. Eat foods that have a lot of lysine in them. These include meat, fish, and dairy products. Avoid sugary foods, chocolates, nuts, and grains. These foods have a high amount of a substance (arginine) that can cause the virus to grow. Lifestyle Do not kiss, have oral sex, or share personal items until your sores heal. Stress, poor sleep, and being out in the sun can trigger a cold sore. Make sure you: Do activities that help you relax, such as deep breathing exercises or meditation. Get enough sleep. Put sunscreen on your lips before you go out in the sun. Contact a doctor if: You have symptoms for more than 2 weeks. You have pus coming from the sores. You have redness that is spreading. You have pain or irritation in your eye. You get sores on your genitals. Your sores do not heal within 2 weeks. You  get cold sores often. Get help right away if: You have a fever and your symptoms suddenly get worse. You have a headache and confusion. You have tiredness (fatigue). You do not want to eat as much as normal (loss of appetite). You have a stiff neck or are sensitive to light. Summary A cold sore is a small, fluid-filled sore that forms inside of the mouth or on the lips, gums, nose, chin, or cheeks. Cold sores can spread from person to person (are contagious) until the sores crust over completely. Most cold sores go away within 2 weeks. Wash your hands often. Do not touch your eyes without washing your hands first. Do not kiss, have oral sex, or share personal items until your sores heal. Contact a doctor if your sores do not heal within 2 weeks. This information is not intended to  replace advice given to you by your health care provider. Make sure you discuss any questions you have with your health care provider. Document Revised: 05/22/2021 Document Reviewed: 05/22/2021 Elsevier Patient Education  2024 Elsevier Inc.   If you have been instructed to have an in-person evaluation today at a local Urgent Care facility, please use the link below. It will take you to a list of all of our available [facility], including address, phone number and hours of operation. Please do not delay care.  [facility]  If you or a family member do not have a primary care provider, use the link below to schedule a visit and establish care. When you choose a [facility] primary care physician or advanced practice provider, you gain a long-term partner in health. Find a Primary Care Provider  Learn more about [facility]'s in-office and virtual care options: [facility] - Get Care Now  "

## 2024-08-19 NOTE — Progress Notes (Signed)
 " Virtual Visit Consent   Teresa Wise, you are scheduled for a virtual visit with a [facility] provider today. Just as with appointments in the office, your consent must be obtained to participate. Your consent will be active for this visit and any virtual visit you may have with one of our providers in the next 365 days. If you have a MyChart account, a copy of this consent can be sent to you electronically.  As this is a virtual visit, video technology does not allow for your provider to perform a traditional examination. This may limit your provider's ability to fully assess your condition. If your provider identifies any concerns that need to be evaluated in person or the need to arrange testing (such as labs, EKG, etc.), we will make arrangements to do so. Although advances in technology are sophisticated, we cannot ensure that it will always work on either your end or our end. If the connection with a video visit is poor, the visit may have to be switched to a telephone visit. With either a video or telephone visit, we are not always able to ensure that we have a secure connection.  By engaging in this virtual visit, you consent to the provision of healthcare and authorize for your insurance to be billed (if applicable) for the services provided during this visit. Depending on your insurance coverage, you may receive a charge related to this service.  I need to obtain your verbal consent now. Are you willing to proceed with your visit today? Teresa Wise has provided verbal consent on 08/19/2024 for a virtual visit (video or telephone). [name], [place]  Date: 08/19/2024 7:36 PM   Virtual Visit via Video Note   I, [name], connected with  Teresa Wise  ([id number], May 02, 1976) on 08/19/2024 at  7:30 PM EST by a video-enabled telemedicine application and verified that I am speaking with the correct person using two identifiers.  Location: Patient: Virtual Visit Location Patient:  Home Provider: Virtual Visit Location Provider: Home Office   I discussed the limitations of evaluation and management by telemedicine and the availability of in person appointments. The patient expressed understanding and agreed to proceed.    History of Present Illness: Teresa Wise is a 48 y.o. who identifies as a female who was assigned female at birth, and is being seen today for c/o on 12/11 she had an URI and also had a cold sore on her nose.  Pt states she went to urgent care and was prescribed 4 pills of the Valtrex  but it went away and then came back in the same spot. Pt is unsure if it fully healed and is requesting a longer course for the Valtrex .    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  Resting comfortably  at home.  Head is normocephalic, atraumatic.  No labored breathing.  Speech is clear and coherent with logical content.  Patient is alert and oriented at baseline.    Assessment and Plan: 1. Herpesviral vesicular dermatitis (Primary) - valACYclovir  (VALTREX ) 1000 MG tablet; Take 1  tablet (1,000 mg total) by mouth 2 (two) times daily for 7 days.  Dispense: 14 tablet; Refill: 0  -Pt advised to follow up in person with PCP or urgent care if symptoms persist or worsen   Follow Up Instructions: I discussed the assessment and treatment plan with the patient. The patient was provided an opportunity to ask questions and all were answered. The patient agreed  with the plan and demonstrated an understanding of the instructions.  A copy of instructions were sent to the patient via MyChart unless otherwise noted below.     The patient was advised to call back or seek an in-person evaluation if the symptoms worsen or if the condition fails to improve as anticipated.    [name], PA-C    [1]  Allergies Allergen Reactions   Adhesive [Tape] Rash    Dermabond  [2]  Current Outpatient Medications:    valACYclovir  (VALTREX ) 1000 MG tablet, Take 1 tablet (1,000 mg total) by mouth 2 (two) times daily for 7 days., Disp: 14 tablet, Rfl: 0   ALPRAZolam  (XANAX  XR) 0.5 MG 24 hr tablet, Take 0.5 mg by mouth in the morning, at noon, and at bedtime., Disp: , Rfl:    busPIRone  (BUSPAR ) 10 MG tablet, TAKE 2 TABLETS BY MOUTH 3 TIMES DAILY., Disp: 540 tablet, Rfl: 0   meloxicam  (MOBIC ) 15 MG tablet, Take 1 tablet (15 mg total) by mouth daily., Disp: 30 tablet, Rfl: 0   propranolol (INDERAL) 10 MG tablet, Take 20 mg by mouth as needed (Tachycardia)., Disp: , Rfl:    Semaglutide -Weight Management (WEGOVY ) 0.25 MG/0.5ML SOAJ, Inject 0.25 mg into the skin once a week., Disp: 2 mL, Rfl: 0   sertraline  (ZOLOFT ) 50 MG tablet, TAKE 1 TABLET BY MOUTH EVERY DAY, Disp: 90 tablet, Rfl: 0   SUMAtriptan  (IMITREX ) 25 MG tablet, Take 2 tablets (50 mg total) by mouth every 2 (two) hours as needed for migraine (ongoing headache). Maximum daily dose 200mg , Disp: 30 tablet, Rfl: 0  "
